# Patient Record
Sex: Female | Born: 1960 | Race: White | Hispanic: No | Marital: Single | Smoking: Never smoker
Health system: Southern US, Community
[De-identification: ages and names within clinical notes are randomized; demographics above are authoritative.]

## PROBLEM LIST (undated history)

## (undated) DIAGNOSIS — C801 Malignant (primary) neoplasm, unspecified: Secondary | ICD-10-CM

## (undated) DIAGNOSIS — R519 Headache, unspecified: Secondary | ICD-10-CM

---

## 1982-04-30 HISTORY — PX: DILATION AND CURETTAGE, DIAGNOSTIC / THERAPEUTIC: SUR384

## 2003-11-25 ENCOUNTER — Other Ambulatory Visit: Admission: RE | Admit: 2003-11-25 | Discharge: 2003-11-25 | Payer: Self-pay | Admitting: Gynecology

## 2003-12-29 ENCOUNTER — Encounter: Admission: RE | Admit: 2003-12-29 | Discharge: 2003-12-29 | Payer: Self-pay | Admitting: Gynecology

## 2005-03-20 ENCOUNTER — Encounter: Admission: RE | Admit: 2005-03-20 | Discharge: 2005-03-20 | Payer: Self-pay | Admitting: Gynecology

## 2005-03-29 ENCOUNTER — Other Ambulatory Visit: Admission: RE | Admit: 2005-03-29 | Discharge: 2005-03-29 | Payer: Self-pay | Admitting: Gynecology

## 2005-04-07 ENCOUNTER — Emergency Department: Payer: Self-pay | Admitting: Unknown Physician Specialty

## 2005-04-17 ENCOUNTER — Encounter: Admission: RE | Admit: 2005-04-17 | Discharge: 2005-04-17 | Payer: Self-pay | Admitting: Gynecology

## 2005-10-29 ENCOUNTER — Encounter: Admission: RE | Admit: 2005-10-29 | Discharge: 2005-10-29 | Payer: Self-pay | Admitting: Gynecology

## 2006-03-25 ENCOUNTER — Encounter: Admission: RE | Admit: 2006-03-25 | Discharge: 2006-03-25 | Payer: Self-pay | Admitting: Gynecology

## 2006-04-11 ENCOUNTER — Other Ambulatory Visit: Admission: RE | Admit: 2006-04-11 | Discharge: 2006-04-11 | Payer: Self-pay | Admitting: Gynecology

## 2006-09-18 ENCOUNTER — Encounter: Payer: Self-pay | Admitting: General Practice

## 2006-09-29 ENCOUNTER — Encounter: Payer: Self-pay | Admitting: General Practice

## 2006-12-16 ENCOUNTER — Emergency Department: Payer: Self-pay | Admitting: Emergency Medicine

## 2006-12-18 ENCOUNTER — Ambulatory Visit: Payer: Self-pay | Admitting: General Practice

## 2006-12-18 IMAGING — CR DG LUMBAR SPINE AP/LAT/OBLIQUES W/ FLEX AND EXT
1 series · 5 of 5 positions shown · non-contrast
Comparison: none

REASON FOR EXAM: low back pain
COMMENTS:

PROCEDURE:     DXR - DXR LUMBAR SPINE WITH OBLIQUES  - [DATE] [DATE]
RESULT:     Comparison: No available comparison exam.

[Series 1: view not recorded · 0.17mm/px · 5 of 5 slices shown]
[im 1/5]
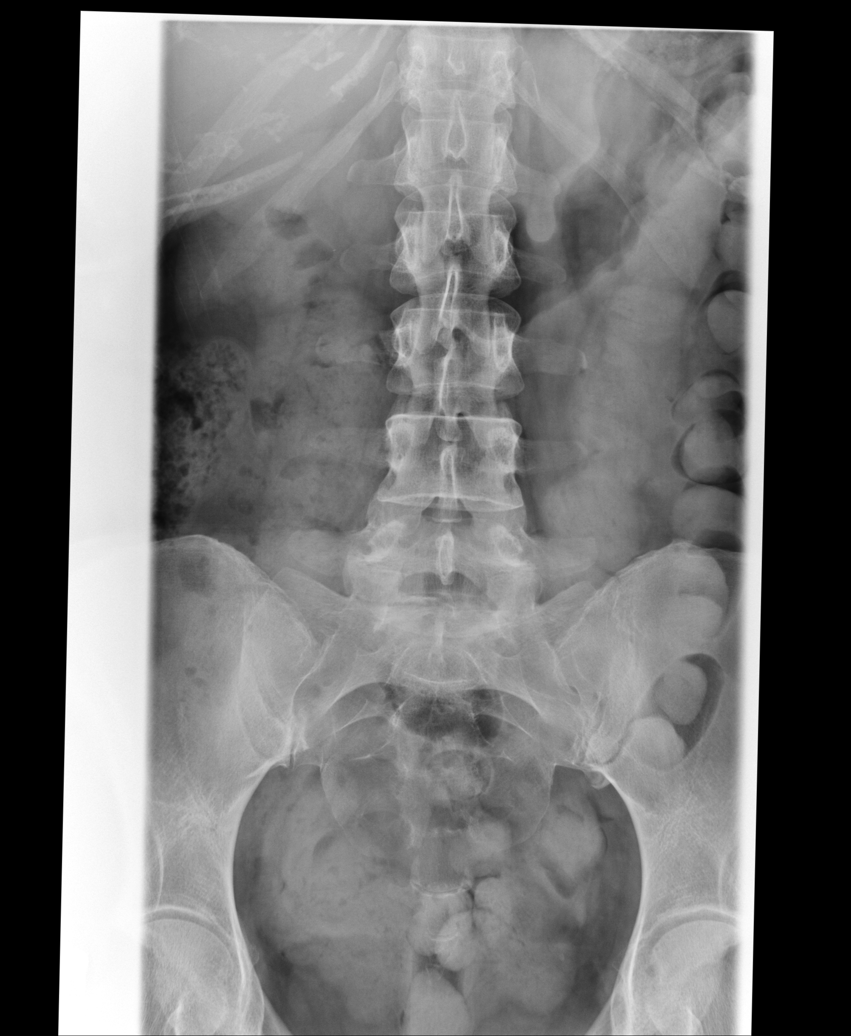
[im 2/5]
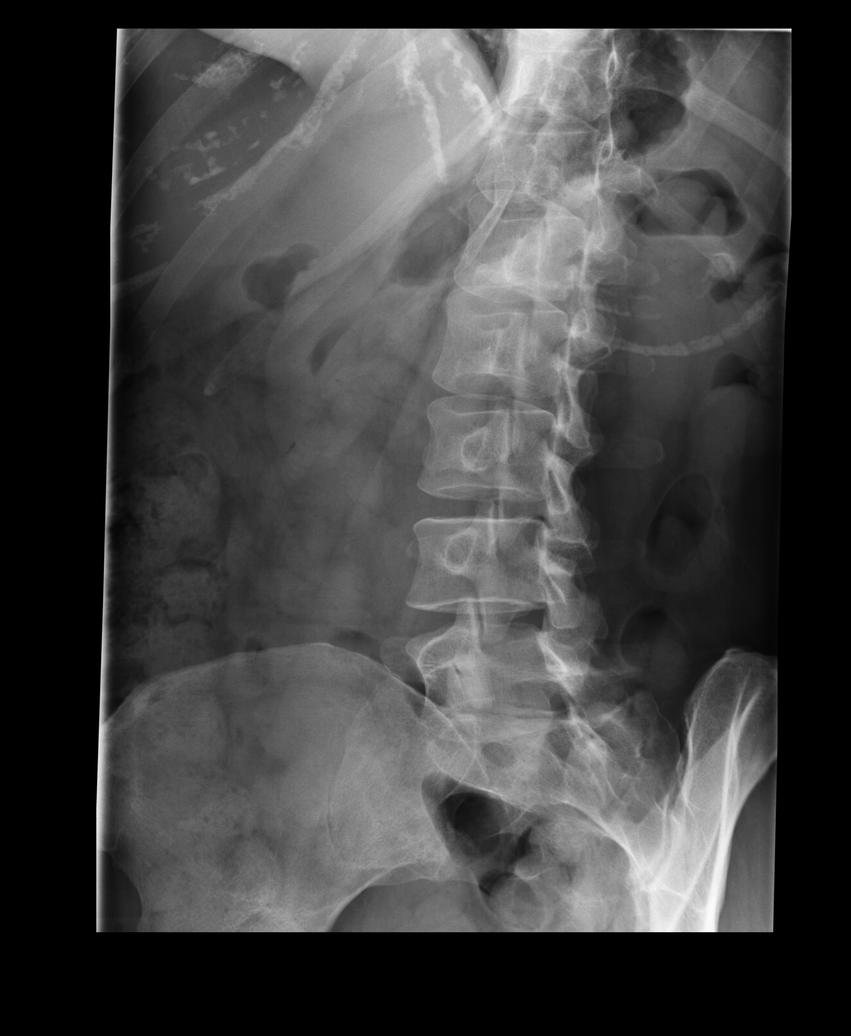
[im 3/5]
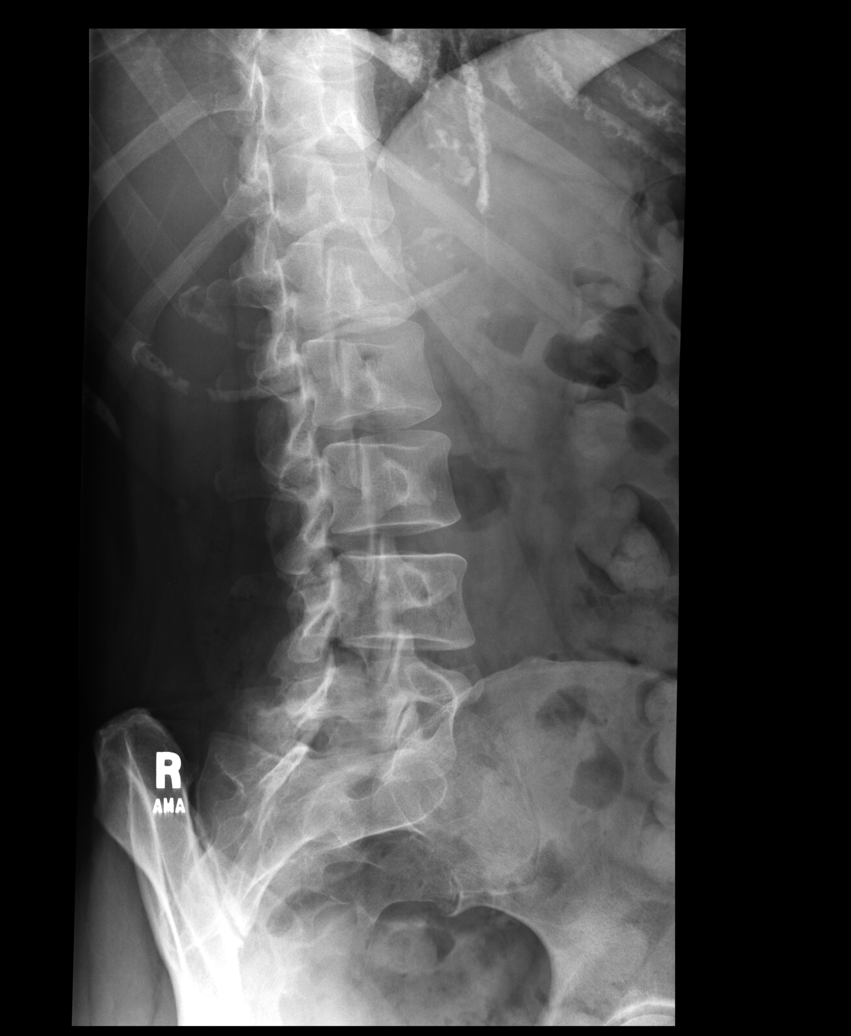
[im 4/5]
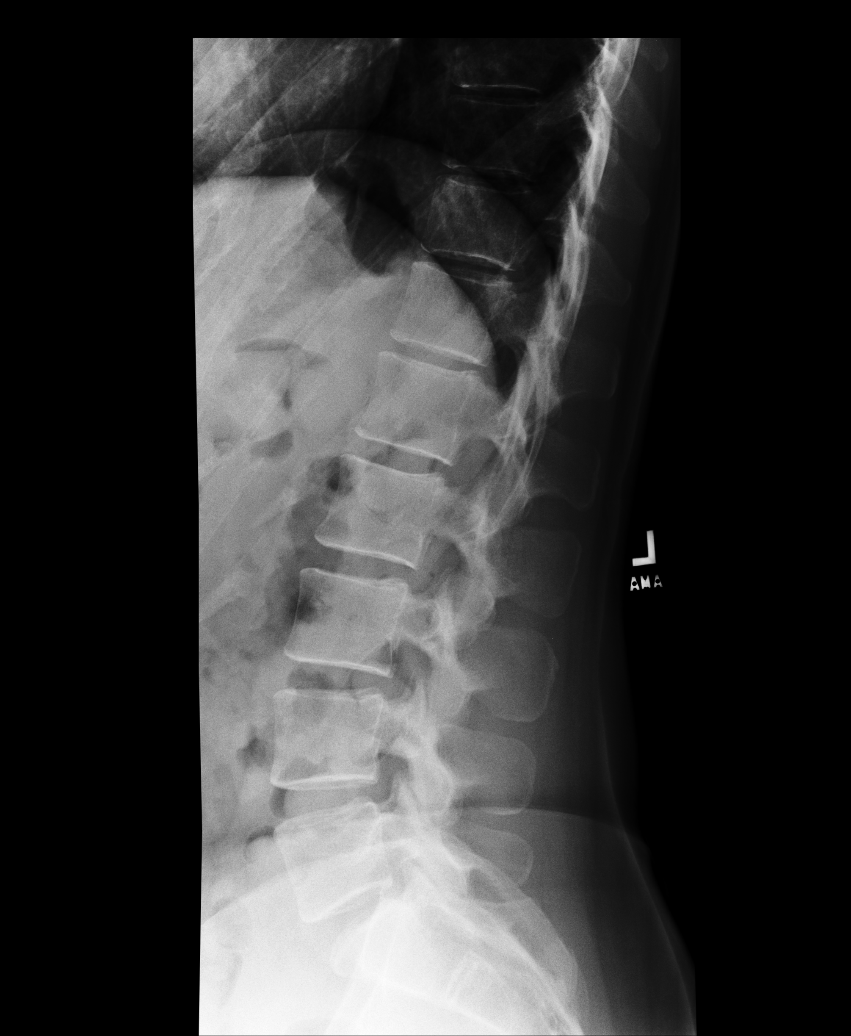
[im 5/5]
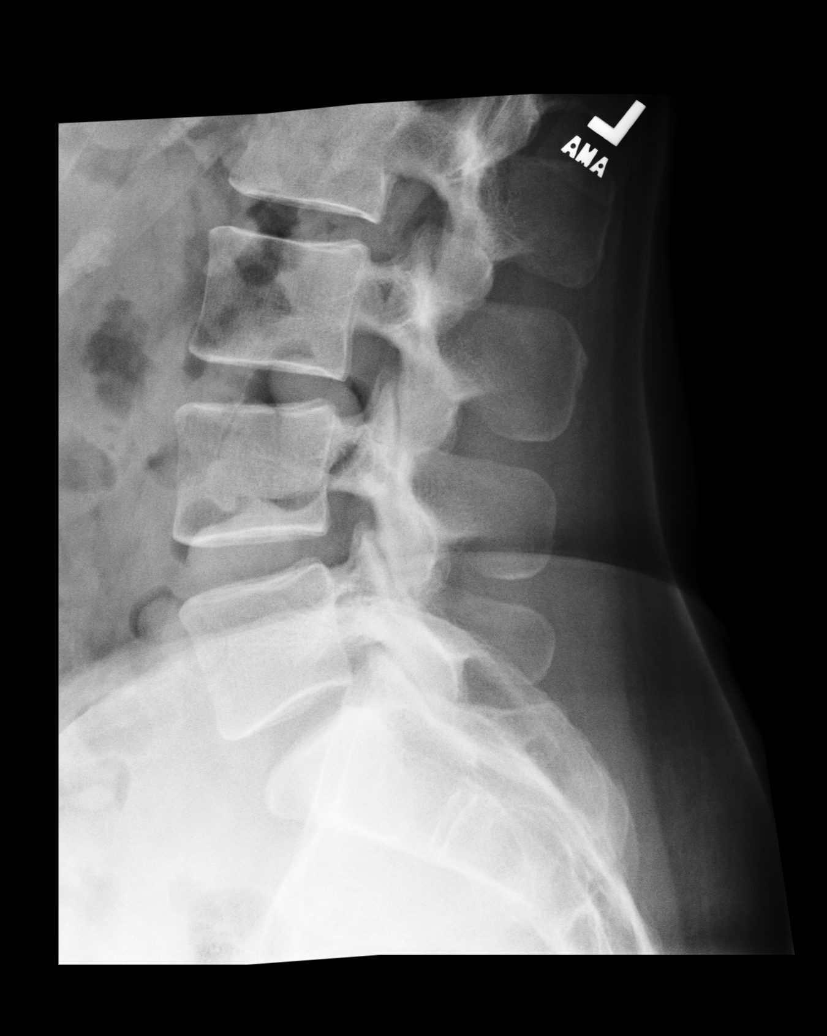

[5 of 5 positions shown; findings below may reference images not displayed]

FINDINGS: Five views of the lumbar spine were obtained.

The lumbar spine is normally alignment. No fracture is noted. The
intervertebral disc spaces are relatively preserved. Moderate to large
amount of stool is noted.
IMPRESSION: 1. Please see above.

## 2007-02-27 ENCOUNTER — Encounter: Payer: Self-pay | Admitting: General Practice

## 2007-03-01 ENCOUNTER — Encounter: Payer: Self-pay | Admitting: General Practice

## 2007-03-31 ENCOUNTER — Encounter: Payer: Self-pay | Admitting: General Practice

## 2007-06-05 ENCOUNTER — Other Ambulatory Visit: Admission: RE | Admit: 2007-06-05 | Discharge: 2007-06-05 | Payer: Self-pay | Admitting: Gynecology

## 2007-06-05 ENCOUNTER — Encounter: Admission: RE | Admit: 2007-06-05 | Discharge: 2007-06-05 | Payer: Self-pay | Admitting: Gynecology

## 2009-07-21 ENCOUNTER — Encounter: Payer: Self-pay | Admitting: General Practice

## 2009-07-29 ENCOUNTER — Encounter: Payer: Self-pay | Admitting: General Practice

## 2009-08-28 ENCOUNTER — Encounter: Payer: Self-pay | Admitting: General Practice

## 2011-11-09 ENCOUNTER — Ambulatory Visit: Payer: Self-pay | Admitting: General Practice

## 2019-04-21 ENCOUNTER — Emergency Department: Payer: 59

## 2019-04-21 ENCOUNTER — Other Ambulatory Visit: Payer: Self-pay

## 2019-04-21 ENCOUNTER — Emergency Department
Admission: EM | Admit: 2019-04-21 | Discharge: 2019-04-21 | Disposition: A | Discharge status: Home / Self Care | Payer: 59 | Attending: Emergency Medicine | Admitting: Emergency Medicine

## 2019-04-21 DIAGNOSIS — R110 Nausea: Secondary | ICD-10-CM | POA: Insufficient documentation

## 2019-04-21 DIAGNOSIS — Z85828 Personal history of other malignant neoplasm of skin: Secondary | ICD-10-CM | POA: Diagnosis not present

## 2019-04-21 DIAGNOSIS — N2889 Other specified disorders of kidney and ureter: Secondary | ICD-10-CM

## 2019-04-21 DIAGNOSIS — K30 Functional dyspepsia: Secondary | ICD-10-CM | POA: Diagnosis not present

## 2019-04-21 DIAGNOSIS — R1012 Left upper quadrant pain: Secondary | ICD-10-CM | POA: Diagnosis present

## 2019-04-21 HISTORY — DX: Malignant (primary) neoplasm, unspecified: C80.1

## 2019-04-21 LAB — COMPREHENSIVE METABOLIC PANEL
ALT: 17 U/L (ref 0–44)
AST: 13 U/L — ABNORMAL LOW (ref 15–41)
Albumin: 4.3 g/dL (ref 3.5–5.0)
Alkaline Phosphatase: 96 U/L (ref 38–126)
Anion gap: 11 (ref 5–15)
BUN: 13 mg/dL (ref 6–20)
CO2: 21 mmol/L — ABNORMAL LOW (ref 22–32)
Calcium: 10.9 mg/dL — ABNORMAL HIGH (ref 8.9–10.3)
Chloride: 106 mmol/L (ref 98–111)
Creatinine, Ser: 0.93 mg/dL (ref 0.44–1.00)
GFR calc Af Amer: >60 mL/min (ref >60)
GFR calc non Af Amer: >60 mL/min (ref >60)
Glucose, Bld: 96 mg/dL (ref 70–99)
Potassium: 4.1 mmol/L (ref 3.5–5.1)
Sodium: 138 mmol/L (ref 135–145)
Total Bilirubin: 0.8 mg/dL (ref 0.3–1.2)
Total Protein: 7.6 g/dL (ref 6.5–8.1)

## 2019-04-21 LAB — URINALYSIS, COMPLETE (UACMP) WITH MICROSCOPIC
Bacteria, UA: NONE SEEN
Bilirubin Urine: NEGATIVE
Glucose, UA: NEGATIVE mg/dL
Hgb urine dipstick: NEGATIVE
Ketones, ur: NEGATIVE mg/dL
Nitrite: NEGATIVE
Protein, ur: NEGATIVE mg/dL
Specific Gravity, Urine: 1.023 (ref 1.005–1.030)
pH: 5 (ref 5.0–8.0)

## 2019-04-21 LAB — CBC
HCT: 46 % (ref 36.0–46.0)
Hemoglobin: 15.4 g/dL — ABNORMAL HIGH (ref 12.0–15.0)
MCH: 29.3 pg (ref 26.0–34.0)
MCHC: 33.5 g/dL (ref 30.0–36.0)
MCV: 87.6 fL (ref 80.0–100.0)
Platelets: 203 10*3/uL (ref 150–400)
RBC: 5.25 MIL/uL — ABNORMAL HIGH (ref 3.87–5.11)
RDW: 12.2 % (ref 11.5–15.5)
WBC: 4.7 10*3/uL (ref 4.0–10.5)
nRBC: 0 % (ref 0.0–0.2)

## 2019-04-21 LAB — LIPASE, BLOOD: Lipase: 23 U/L (ref 11–51)

## 2019-04-21 LAB — C DIFFICILE QUICK SCREEN W PCR REFLEX
C Diff antigen: NEGATIVE
C Diff interpretation: NOT DETECTED
C Diff toxin: NEGATIVE

## 2019-04-21 LAB — POCT PREGNANCY, URINE: Preg Test, Ur: NEGATIVE

## 2019-04-21 MED ORDER — DICYCLOMINE HCL 10 MG/5ML PO SOLN
10.0000 mg | Freq: Once | ORAL | Status: AC
  Administered 2019-04-21: 12:00:00 10 mg via ORAL
  Filled 2019-04-21: qty 5

## 2019-04-21 MED ORDER — IOHEXOL 300 MG/ML  SOLN
100.0000 mL | Freq: Once | INTRAMUSCULAR | Status: AC | PRN
  Administered 2019-04-21: 11:00:00 100 mL via INTRAVENOUS

## 2019-04-21 MED ORDER — IOHEXOL 9 MG/ML PO SOLN
500.0000 mL | ORAL | Status: AC
  Administered 2019-04-21 (×2): 500 mL via ORAL

## 2019-04-21 MED ORDER — SODIUM CHLORIDE 0.9% FLUSH
3.0000 mL | Freq: Once | INTRAVENOUS | Status: AC
  Administered 2019-04-21: 10:00:00 3 mL via INTRAVENOUS

## 2019-04-21 MED ORDER — LIDOCAINE VISCOUS HCL 2 % MT SOLN
15.0000 mL | Freq: Once | OROMUCOSAL | Status: AC
  Administered 2019-04-21: 10:00:00 15 mL via ORAL
  Filled 2019-04-21: qty 15

## 2019-04-21 MED ORDER — DICYCLOMINE HCL 10 MG PO CAPS: 10.0000 mg | ORAL_CAPSULE | Freq: Four times a day (QID) | ORAL | 0 refills | Status: DC

## 2019-04-21 MED ORDER — ALUM & MAG HYDROXIDE-SIMETH 200-200-20 MG/5ML PO SUSP
30.0000 mL | Freq: Once | ORAL | Status: AC
  Administered 2019-04-21: 10:00:00 30 mL via ORAL
  Filled 2019-04-21: qty 30

## 2019-04-21 NOTE — ED Notes (Signed)
Pt presents to the ED for LUQ pain that has been going on for a couple of days. Pt denies NVD and fever. Denies CP and SOB. On assessment, abd is tender to touch in LUQ

## 2019-04-21 NOTE — ED Provider Notes (Signed)
[facility] Emergency Department Provider Note   ____________________________________________   First MD Initiated Contact with Patient 04/21/19 [phone]     (approximate)  I have reviewed the triage vital signs and the nursing notes.   HISTORY  Chief Complaint Abdominal Pain    HPI Tracey Massey is a 58 y.o. female has no significant past medical history except that of distant skin cancer  Patient reports that for about 3 months now she has been experiencing pain that seems to be fairly consistent sometimes worsens located in the left upper quadrant.  Is been associated with change in her stool consistency reportedly feeling like a "fatty" consistency to her stool but no still be formed.  Had a bowel movement this morning.  No black or bloody stool.  Some nausea at times but no vomiting.  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  No chest pain no trouble breathing.  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  No bloody stool.  No vomiting.  Pain currently mild left upper quadrant but will worsen at times sometimes seems to feel like he gets a lot worse if she is had dairy products.  No known exposure to amoebas or obvious infection   Past Medical History:  Diagnosis Date  . Cancer ([place])    skin    There are no problems to display for this patient.   History reviewed. No pertinent surgical history.  Prior to Admission medications   Medication Sig Start Date End Date Taking? Authorizing Provider  dicyclomine (BENTYL) 10 MG capsule Take 1 capsule (10 mg total) by mouth 4 (four) times daily for 7 days. 04/21/19 04/28/19  Delman Kitten, MD     Allergies Patient has no known allergies.  No family history on file.  Social History Social History   Tobacco Use  . Smoking status: Never Smoker  . Smokeless tobacco: Never Used  Substance Use Topics  . Alcohol use: Not Currently  . Drug use: Not Currently    Review of Systems Constitutional: No fever/chills Eyes: No visual changes. ENT: No sore throat. Cardiovascular: Denies chest pain. Respiratory: Denies shortness of breath. Gastrointestinal: See HPI Genitourinary: Negative for dysuria. Musculoskeletal: Negative for back pain. Skin: Negative for rash. Neurological: Negative for weakness.    ____________________________________________   PHYSICAL EXAM:  VITAL SIGNS: ED Triage Vitals  Enc Vitals Group     BP 04/21/19 0859 (!) 152/98     Pulse Rate 04/21/19 0859 92     Resp 04/21/19 0859 18     Temp 04/21/19 0900 98 F (36.7 C)     Temp Source 04/21/19 0859 Oral     SpO2 04/21/19 0859 97 %     Weight 04/21/19 0859 185 lb (83.9 kg)     Height 04/21/19 0859 6\' 1"  (1.854 m)     Head Circumference --      Peak Flow --      Pain Score 04/21/19 0859 5     Pain Loc --      Pain Edu? --      Excl. in [place]? --     Constitutional: Alert and  oriented. Well appearing and in no acute distress. Eyes: Conjunctivae are normal. Head: Atraumatic. Nose: No congestion/rhinnorhea. Mouth/Throat: Mucous membranes are moist. Neck: No stridor.  Cardiovascular: Normal rate, regular rhythm. Grossly normal heart sounds.  Good peripheral circulation. Respiratory: Normal respiratory effort.  No retractions. Lungs CTAB. Gastrointestinal: Soft and nontender except she does report mild to moderate tenderness in the left upper quadrant without peritonitis. No distention. Musculoskeletal: No lower extremity tenderness nor edema. Neurologic:  Normal speech and language. No gross focal neurologic deficits are appreciated.  Skin:  Skin is warm, dry and intact. No rash  noted. Psychiatric: Mood and affect are normal. Speech and behavior are normal.  ____________________________________________   LABS (all labs ordered are listed, but only abnormal results are displayed)  Labs Reviewed  COMPREHENSIVE METABOLIC PANEL - Abnormal; Notable for the following components:      Result Value   CO2 21 (*)    Calcium 10.9 (*)    AST 13 (*)    All other components within normal limits  CBC - Abnormal; Notable for the following components:   RBC 5.25 (*)    Hemoglobin 15.4 (*)    All other components within normal limits  URINALYSIS, COMPLETE (UACMP) WITH MICROSCOPIC - Abnormal; Notable for the following components:   Color, Urine YELLOW (*)    APPearance HAZY (*)    Leukocytes,Ua SMALL (*)    All other components within normal limits  C DIFFICILE QUICK SCREEN W PCR REFLEX  GIARDIA, EIA; OVA/PARASITE  GI PATHOGEN PANEL BY PCR, STOOL  LIPASE, BLOOD  POC URINE PREG, ED  POCT PREGNANCY, URINE   ____________________________________________  EKG  Reviewed entered by me at 9 AM Heart rate 99 QRS 99 QTc 450 Normal sinus rhythm, possible old anterior infarct.  No evidence of acute ischemia ____________________________________________  RADIOLOGY  Discussed risks and benefits of CT scanning including possibility given her symptoms we may not find a cause but given the patient's ongoing symptomatology and likely return to [place] at end of week she would prefer to go with CT imaging to evaluate for gross etiology for pain, and I think this is very reasonable.  This would help [place] exclude some elements of colitis, diverticulitis, mass, or other gross abnormalities that may explain some of her stooling symptom changes as well as left upper quadrant pain.  CT ABDOMEN PELVIS W CONTRAST  Result Date: 04/21/2019 CLINICAL DATA:  Abdominal pain, primarily left-sided EXAM: CT ABDOMEN AND PELVIS WITH CONTRAST TECHNIQUE: Multidetector CT imaging of the abdomen and  pelvis was performed using the standard protocol following bolus administration of intravenous contrast. Oral contrast was also administered. CONTRAST:  133mL OMNIPAQUE IOHEXOL 300 MG/ML  SOLN COMPARISON:  November 09, 2011 FINDINGS: Lower chest: There is atelectatic change in the posterior right base. There is no lung base edema or consolidation. There is pectus excavatum. Hepatobiliary: There is fatty infiltration near the fissure for the ligamentum teres. No focal liver lesions are evident. The gallbladder wall is not appreciably thickened. There is no biliary duct dilatation. Pancreas: No pancreatic mass or inflammatory focus. Spleen: No splenic lesions are evident. Adrenals/Urinary Tract: There is stable left adrenal hypertrophy. Right adrenal appears normal. There is a solid mass arising from the upper pole left kidney measuring 2.3 x 2.2 x 2.2 cm. This mass does not invade the collecting system and is distant from the left renal vein. No other mass evident. There is no hydronephrosis on either side. There is an extrarenal pelvis on the right,  an anatomic variant. There is no evident renal or ureteral calculus on either side. Urinary bladder is midline with wall thickness within normal limits. Stomach/Bowel: There is moderate stool in the colon. There is no appreciable bowel wall or mesenteric thickening. There is no evident bowel obstruction. The terminal ileum appears normal. There is mild lipomatous infiltration of the ileocecal valve. There is no free air or portal venous air. Vascular/Lymphatic: There is no abdominal aortic aneurysm. No vascular lesions are appreciable on this study. Major venous structures appear patent. There is no evident adenopathy in the abdomen or pelvis. Reproductive: Uterus is mildly retroverted. There is no pelvic mass evident. Other: There is no periappendiceal region inflammation. There is no abscess or ascites in the abdomen or pelvis. There is mild fat in the umbilicus.  Musculoskeletal: There are no blastic or lytic bone lesions. There is no intramuscular lesion. IMPRESSION: 1. There is a solid mass arising from the upper pole left kidney somewhat eccentrically measuring 2.3 x 2.2 x 2.2 cm. Renal cell carcinoma must be of concern given this appearance. Further assessment advised. Further evaluation with pre and post contrast MRI or CT should be considered. MRI is preferred in younger patients (due to lack of ionizing radiation) and for evaluating calcified lesion(s). Urologic consultation given this finding also advised. 2. Prominent extrarenal pelvis on the right, an anatomic variant. No hydronephrosis on either side. No renal or ureteral calculus on either side. Urinary bladder wall thickness normal. 3. No bowel wall thickening or bowel obstruction. No abscess evident in the abdomen or pelvis. 4.  Pectus excavatum. Electronically Signed   By: Lowella Grip III M.D.   On: 04/21/2019 11:19     ____________________________________________   PROCEDURES  Procedure(s) performed: None  Procedures  Critical Care performed: No  ____________________________________________   INITIAL IMPRESSION / ASSESSMENT AND PLAN / ED COURSE  Pertinent labs & imaging results that were available during my care of the patient were reviewed by me and considered in my medical decision making (see chart for details).   Differential diagnosis includes but is not limited to, abdominal perforation, aortic dissection, cholecystitis, appendicitis, diverticulitis, colitis, esophagitis/gastritis, kidney stone, pyelonephritis, urinary tract infection, aortic aneurysm. All are considered in decision and treatment plan. Based upon the patient's presentation and risk factors, it seems likely related to some sort of acute intestinal illness or potentially some sort of chronic illness been ongoing for a few months now.  Primarily left upper quadrant pain associated with change in stool  consistency.  We will send stool testing, encourage follow-up with gastroenterology and will screen for acute etiologies with CT scanning.  Outpatient stool studies ordered, patient agreeable to submitting these and follow-up.   Clinical Course as of Apr 20 1333  Tue Apr 21, 2019  1226 Paged urology to discuss CT finding   [MQ]  1310 Urology repaged   [MQ]  1317 History and CT discussed with Dr. Bernardo Heater, Urology.    [MQ]    Clinical Course User Index [MQ] Delman Kitten, MD     ____________________________________________   FINAL CLINICAL IMPRESSION(S) / ED DIAGNOSES  Final diagnoses:  Renal mass  Upset stomach        Note:  This document was prepared using Dragon voice recognition software and may include unintentional dictation errors       Delman Kitten, MD 04/21/19 [phone]

## 2019-04-21 NOTE — ED Notes (Signed)
Pt ambulated to restroom without assistance.

## 2019-04-21 NOTE — ED Triage Notes (Signed)
Pt c/o LUQ pain , new intolerance to dairy products that seem to increase the pain, states she is having mucous like with stools for several months, some nausea with the sx. States this has been going on for several months but in the last several days became unbearable

## 2019-04-25 LAB — GI PATHOGEN PANEL BY PCR, STOOL

## 2019-04-27 LAB — GIARDIA, EIA; OVA/PARASITE: Giardia Ag, Stl: NEGATIVE

## 2019-04-27 LAB — O&P RESULT

## 2019-04-28 ENCOUNTER — Telehealth: Payer: Self-pay | Admitting: Urology

## 2019-04-28 NOTE — Telephone Encounter (Signed)
APP CHANGED LM FOR PT TO CB TO CONFIRM   Tracey Massey

## 2019-04-28 NOTE — Telephone Encounter (Signed)
-----   Message from Abbie Sons, MD sent at 04/28/2019  7:40 AM EST ----- Regarding: FW: Renal mass, needs follow-up This patient needs to be scheduled with Dr. Diamantina Providence or Dr. Su Ley was interested in renal surgery ----- Message ----- From: Delman Kitten, MD Sent: 04/21/2019   1:20 PM EST To: Abbie Sons, MD Subject: Renal mass, needs follow-up                    Needs follow-up. Renal mass

## 2019-05-06 ENCOUNTER — Ambulatory Visit: Payer: Self-pay | Admitting: Urology

## 2019-05-07 ENCOUNTER — Encounter: Payer: Self-pay | Admitting: Urology

## 2019-05-07 ENCOUNTER — Ambulatory Visit (INDEPENDENT_AMBULATORY_CARE_PROVIDER_SITE_OTHER): Payer: 59 | Admitting: Urology

## 2019-05-07 ENCOUNTER — Other Ambulatory Visit: Payer: Self-pay

## 2019-05-07 VITALS — BP 122/83 | HR 80 | Ht 73.0 in | Wt 165.0 lb

## 2019-05-07 DIAGNOSIS — N2889 Other specified disorders of kidney and ureter: Secondary | ICD-10-CM

## 2019-05-07 NOTE — Progress Notes (Addendum)
05/07/19 2:08 PM   Venetia Night L Agnes [birth year] [id number]  CC: Left renal mass  HPI: I saw Ms. Madill in urology clinic today for evaluation of a small left renal mass.  She is a very healthy 59 year old female with no other significant medical problems and no prior abdominal surgeries who was recently seen in the ER on 04/21/2019 with diarrhea and abdominal pain.  A CT scan with contrast was performed and showed a 2.3 cm irregular exophytic left upper pole renal mass concerning for possible RCC.  Enhancement was unable to be evaluated as it was only a contrasted scan.  There is no prior imaging to review.  Labs at her ER visit were benign with normal renal function.  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  She denies any gross hematuria, flank pain, weight loss, or bone pain.  She denies any fevers, chills, night sweats, or constitutional symptoms.  She is a non-smoker and denies any other carcinogenic exposures.   PMH: Past Medical History:  Diagnosis Date  . Cancer [facility])    skin    Surgical History: No prior abdominal surgeries  Allergies: No Known Allergies  Family History: No family history on file.  Social History:  reports that she has never smoked. She has never used smokeless tobacco. She reports previous alcohol use. She reports previous drug use.  ROS: Please see flowsheet from today's date for complete review of systems.  Physical Exam: BP 122/83   Pulse 80   Ht _0  (1.854 m)   Wt 165 lb (74.8 kg)   BMI 21.77 kg/m    Constitutional:  Alert and oriented, No acute distress. Cardiovascular: No clubbing, cyanosis, or edema. Respiratory: Normal respiratory effort, no increased work of breathing. GI: Abdomen is soft, nontender, nondistended, no abdominal masses GU: No CVA tenderness Lymph: No cervical or inguinal lymphadenopathy. Skin: No rashes, bruises or  suspicious lesions. Neurologic: Grossly intact, no focal deficits, moving all 4 extremities. Psychiatric: Normal mood and affect.  Laboratory Data: Reviewed Normal renal function with creatinine 0.93, EGFR greater than 60  Pertinent Imaging: I have personally reviewed the CT dated 12/22, see HPI for details  Assessment & Plan:   In summary the patient is a 59 year old healthy female with a 2.3 cm solid left upper pole renal mass worrisome for possible renal cell carcinoma.  We reviewed her images at length together today.  I recommended further evaluation with an MRI of the abdomen to evaluate for enhancement.  We discussed the role of biopsy and renal masses and consideration of renal biopsy if MRI is equivocal.  We also discussed possible treatment options if this is an enhancing renal mass worrisome for malignancy including ablation, partial nephrectomy, radical nephrectomy.  With her age in good health, tumor location and size, she would be a good candidate for a robotic left partial nephrectomy pending MRI findings.  MRI abdomen for better evaluation of small left renal mass, will call with results  A total of 35 minutes were spent face-to-face with the patient, greater than 50% was spent in patient education, counseling, and coordination of care regarding small renal mass and treatment options.  Addendum: MRI 05/17/2019 shows a 2.2 cm left upper pole solid enhancing renal lesion worrisome for renal cell carcinoma versus oncocytoma.  I discussed these findings with her over the phone this evening.  We discussed treatment options including  active surveillance, robotic partial nephrectomy, or ablation with interventional radiology.  She is working as an Programmer, applications during the Covid pandemic currently and is in [place] at this time.  She reports that she does not have any time off or available for surgery or procedure until December 2021.  We discussed the low, but not zero, risk of  developing metastatic disease on surveillance.  I recommended repeating imaging in 3 to 4 months with CT abdomen pelvis with and without contrast to evaluate for any significant change in the size of the lesion.  She is amenable to this plan.   Virtual visit in 3 months to coordinate CT abdomen pelvis with and without contrast and chest x-ray in [place], will need to obtain report and actual disc so I can review films.   Billey Co, [facility] [address], [place], [place] [address] [phone]

## 2019-05-08 LAB — URINALYSIS, COMPLETE
Bilirubin, UA: NEGATIVE
Glucose, UA: NEGATIVE
Ketones, UA: NEGATIVE
Nitrite, UA: NEGATIVE
Protein,UA: NEGATIVE
RBC, UA: NEGATIVE
Specific Gravity, UA: >1.03 — ABNORMAL HIGH (ref 1.005–1.030)
Urobilinogen, Ur: 0.2 mg/dL (ref 0.2–1.0)
pH, UA: 5.5 (ref 5.0–7.5)

## 2019-05-08 LAB — MICROSCOPIC EXAMINATION
Bacteria, UA: NONE SEEN
RBC, Urine: NONE SEEN /hpf (ref 0–2)

## 2019-05-16 ENCOUNTER — Ambulatory Visit
Admission: RE | Admit: 2019-05-16 | Discharge: 2019-05-16 | Disposition: A | Discharge status: Home / Self Care | Payer: 59 | Source: Ambulatory Visit | Attending: Urology | Admitting: Urology

## 2019-05-16 ENCOUNTER — Other Ambulatory Visit: Payer: Self-pay

## 2019-05-16 DIAGNOSIS — N2889 Other specified disorders of kidney and ureter: Secondary | ICD-10-CM | POA: Diagnosis not present

## 2019-05-16 MED ORDER — GADOBUTROL 1 MMOL/ML IV SOLN
7.5000 mL | Freq: Once | INTRAVENOUS | Status: AC | PRN
  Administered 2019-05-16: 7.5 mL via INTRAVENOUS

## 2019-05-22 ENCOUNTER — Telehealth: Payer: Self-pay | Admitting: Urology

## 2019-05-22 NOTE — Telephone Encounter (Signed)
Pt called and asked if you could give her a call back concerning her MRI results that were given to her yesterday.

## 2019-05-22 NOTE — Telephone Encounter (Signed)
I called pt to make 3 month follow up and she would like to go ahead and schedule surgery.  I told her I would get back with her.  She wants to get on the surgery schedule now.  Let me know what I should do.

## 2019-06-02 ENCOUNTER — Other Ambulatory Visit: Payer: Self-pay | Admitting: Radiology

## 2019-06-08 NOTE — Telephone Encounter (Signed)
LMOM to return call regarding surgery.

## 2019-06-12 ENCOUNTER — Other Ambulatory Visit: Payer: Self-pay | Admitting: Radiology

## 2019-06-12 DIAGNOSIS — N2889 Other specified disorders of kidney and ureter: Secondary | ICD-10-CM

## 2019-06-15 ENCOUNTER — Ambulatory Visit: Payer: 59 | Admitting: Gastroenterology

## 2019-06-19 ENCOUNTER — Other Ambulatory Visit: Payer: 59

## 2019-06-25 ENCOUNTER — Other Ambulatory Visit: Payer: 59

## 2019-07-02 ENCOUNTER — Other Ambulatory Visit: Payer: Self-pay

## 2019-07-02 DIAGNOSIS — N2889 Other specified disorders of kidney and ureter: Secondary | ICD-10-CM

## 2019-07-03 ENCOUNTER — Other Ambulatory Visit (INDEPENDENT_AMBULATORY_CARE_PROVIDER_SITE_OTHER): Payer: 59

## 2019-07-03 ENCOUNTER — Other Ambulatory Visit: Payer: Self-pay

## 2019-07-03 DIAGNOSIS — N2889 Other specified disorders of kidney and ureter: Secondary | ICD-10-CM

## 2019-07-03 LAB — URINALYSIS, COMPLETE
Bilirubin, UA: NEGATIVE
Glucose, UA: NEGATIVE
Ketones, UA: NEGATIVE
Leukocytes,UA: NEGATIVE
Nitrite, UA: NEGATIVE
Protein,UA: NEGATIVE
RBC, UA: NEGATIVE
Specific Gravity, UA: >1.03 — ABNORMAL HIGH (ref 1.005–1.030)
Urobilinogen, Ur: 0.2 mg/dL (ref 0.2–1.0)
pH, UA: 5.5 (ref 5.0–7.5)

## 2019-07-03 LAB — MICROSCOPIC EXAMINATION
Bacteria, UA: NONE SEEN
RBC, Urine: NONE SEEN /hpf (ref 0–2)

## 2019-07-06 LAB — CULTURE, URINE COMPREHENSIVE

## 2019-07-07 ENCOUNTER — Encounter
Admission: RE | Admit: 2019-07-07 | Discharge: 2019-07-07 | Disposition: A | Discharge status: Home / Self Care | Payer: 59 | Source: Ambulatory Visit | Attending: Urology | Admitting: Urology

## 2019-07-07 ENCOUNTER — Other Ambulatory Visit: Payer: Self-pay

## 2019-07-07 DIAGNOSIS — Z01818 Encounter for other preprocedural examination: Secondary | ICD-10-CM | POA: Diagnosis present

## 2019-07-07 HISTORY — DX: Headache, unspecified: R51.9

## 2019-07-07 NOTE — Patient Instructions (Signed)
Your procedure is scheduled on: Monday July 13, 2019 Report to Day Surgery. To find out your arrival time please call [phone] between 1PM - 3PM on Friday July 10, 2019.  Remember: Instructions that are not followed completely may result in serious medical risk,  up to and including death, or upon the discretion of your surgeon and anesthesiologist your  surgery may need to be rescheduled.     _X__ 1. Do not eat food after midnight the night before your procedure.                 No gum chewing or hard candies. You may drink clear liquids up to 2 hours                 before you are scheduled to arrive for your surgery- DO not drink clear                 liquids within 2 hours of the start of your surgery.                 Clear Liquids include:  water, apple juice without pulp, clear Gatorade, G2 or                  Gatorade Zero (avoid Red/Purple/Blue), Black Coffee or Tea (Do not add                 anything to coffee or tea).  __X__2.  On the morning of surgery brush your teeth with toothpaste and water, you                may rinse your mouth with mouthwash if you wish.  Do not swallow any toothpaste of mouthwash.     _X__ 3.  No Alcohol for 24 hours before or after surgery.   _X__ 4.  Do Not Smoke or use e-cigarettes For 24 Hours Prior to Your Surgery.                 Do not use any chewable tobacco products for at least 6 hours prior to                 surgery.  __x__  5.  Notify your doctor if there is any change in your medical condition      (cold, fever, infections).     Do not wear jewelry, make-up, hairpins, clips or nail polish. Do not wear lotions, powders, or perfumes. You may wear deodorant. Do not shave 48 hours prior to surgery. Men may shave face and neck. Do not bring valuables to the hospital.    [facility] is not responsible for any belongings or valuables.  Contacts, dentures or bridgework may not be worn into surgery. Leave  your suitcase in the car. After surgery it may be brought to your room. For patients admitted to the hospital, discharge time is determined by your treatment team.   Patients discharged the day of surgery will not be allowed to drive home.   Make arrangements for someone to be with you for the first 24 hours of your Same Day Discharge.   ____ Take these medicines the morning of surgery with A SIP OF WATER:    1. none   __x__ Use CHG Soap (or wipes) as directed  __x__ Stop Anti-inflammatories such as aspirin, ibuprofen, naproxen, Aleve and or BC powders.   __x__ Stop supplements until after surgery.    __x__ Do not start any herbal  supplements before your surgery.

## 2019-07-09 ENCOUNTER — Other Ambulatory Visit: Payer: Self-pay

## 2019-07-09 ENCOUNTER — Other Ambulatory Visit
Admission: RE | Admit: 2019-07-09 | Discharge: 2019-07-09 | Disposition: A | Discharge status: Home / Self Care | Payer: 59 | Source: Ambulatory Visit | Attending: Urology | Admitting: Urology

## 2019-07-09 DIAGNOSIS — Z20822 Contact with and (suspected) exposure to covid-19: Secondary | ICD-10-CM | POA: Diagnosis not present

## 2019-07-09 DIAGNOSIS — Z01812 Encounter for preprocedural laboratory examination: Secondary | ICD-10-CM | POA: Diagnosis present

## 2019-07-09 LAB — SARS CORONAVIRUS 2 (TAT 6-24 HRS): SARS Coronavirus 2: NEGATIVE

## 2019-07-09 LAB — CBC
HCT: 48.1 % — ABNORMAL HIGH (ref 36.0–46.0)
Hemoglobin: 15.6 g/dL — ABNORMAL HIGH (ref 12.0–15.0)
MCH: 29.3 pg (ref 26.0–34.0)
MCHC: 32.4 g/dL (ref 30.0–36.0)
MCV: 90.4 fL (ref 80.0–100.0)
Platelets: 185 10*3/uL (ref 150–400)
RBC: 5.32 MIL/uL — ABNORMAL HIGH (ref 3.87–5.11)
RDW: 12.5 % (ref 11.5–15.5)
WBC: 4.3 10*3/uL (ref 4.0–10.5)
nRBC: 0 % (ref 0.0–0.2)

## 2019-07-09 LAB — BASIC METABOLIC PANEL
Anion gap: 8 (ref 5–15)
BUN: 15 mg/dL (ref 6–20)
CO2: 26 mmol/L (ref 22–32)
Calcium: 10.4 mg/dL — ABNORMAL HIGH (ref 8.9–10.3)
Chloride: 107 mmol/L (ref 98–111)
Creatinine, Ser: 0.9 mg/dL (ref 0.44–1.00)
GFR calc Af Amer: >60 mL/min (ref >60)
GFR calc non Af Amer: >60 mL/min (ref >60)
Glucose, Bld: 78 mg/dL (ref 70–99)
Potassium: 3.4 mmol/L — ABNORMAL LOW (ref 3.5–5.1)
Sodium: 141 mmol/L (ref 135–145)

## 2019-07-09 LAB — PROTIME-INR
INR: 0.9 (ref 0.8–1.2)
Prothrombin Time: 12.2 seconds (ref 11.4–15.2)

## 2019-07-13 ENCOUNTER — Inpatient Hospital Stay
Admission: RE | Admit: 2019-07-13 | Discharge: 2019-07-15 | DRG: 658 | Disposition: A | Discharge status: Home / Self Care | Payer: 59 | Source: Ambulatory Visit | Attending: Urology | Admitting: Urology

## 2019-07-13 ENCOUNTER — Other Ambulatory Visit: Payer: Self-pay

## 2019-07-13 ENCOUNTER — Inpatient Hospital Stay: Payer: 59 | Admitting: Anesthesiology

## 2019-07-13 ENCOUNTER — Encounter
Admission: RE | Disposition: A | Discharge status: Home / Self Care | Payer: Self-pay | Source: Ambulatory Visit | Attending: Urology

## 2019-07-13 ENCOUNTER — Inpatient Hospital Stay: Payer: 59

## 2019-07-13 ENCOUNTER — Encounter: Payer: Self-pay | Admitting: Urology

## 2019-07-13 DIAGNOSIS — R110 Nausea: Secondary | ICD-10-CM | POA: Diagnosis not present

## 2019-07-13 DIAGNOSIS — D1771 Benign lipomatous neoplasm of kidney: Secondary | ICD-10-CM | POA: Diagnosis present

## 2019-07-13 DIAGNOSIS — Z20822 Contact with and (suspected) exposure to covid-19: Secondary | ICD-10-CM | POA: Diagnosis present

## 2019-07-13 DIAGNOSIS — N2889 Other specified disorders of kidney and ureter: Secondary | ICD-10-CM | POA: Diagnosis present

## 2019-07-13 DIAGNOSIS — Z85828 Personal history of other malignant neoplasm of skin: Secondary | ICD-10-CM | POA: Diagnosis not present

## 2019-07-13 DIAGNOSIS — Z01818 Encounter for other preprocedural examination: Secondary | ICD-10-CM

## 2019-07-13 DIAGNOSIS — D49512 Neoplasm of unspecified behavior of left kidney: Secondary | ICD-10-CM

## 2019-07-13 HISTORY — PX: ROBOTIC ASSITED PARTIAL NEPHRECTOMY: SHX6087

## 2019-07-13 LAB — ABO/RH: ABO/RH(D): A NEG

## 2019-07-13 SURGERY — NEPHRECTOMY, PARTIAL, ROBOT-ASSISTED
Anesthesia: General | Laterality: Left

## 2019-07-13 MED ORDER — LACTATED RINGERS IV SOLN: INTRAVENOUS | Status: DC

## 2019-07-13 MED ORDER — MANNITOL 25 % IV SOLN
INTRAVENOUS | Status: DC | PRN
  Administered 2019-07-13 (×2): 12.5 g via INTRAVENOUS

## 2019-07-13 MED ORDER — FENTANYL CITRATE (PF) 100 MCG/2ML IJ SOLN
INTRAMUSCULAR | Status: AC
  Filled 2019-07-13: qty 2

## 2019-07-13 MED ORDER — CEFAZOLIN SODIUM-DEXTROSE 1-4 GM/50ML-% IV SOLN
1.0000 g | Freq: Three times a day (TID) | INTRAVENOUS | Status: AC
  Administered 2019-07-13: 1 g via INTRAVENOUS
  Filled 2019-07-13: qty 50

## 2019-07-13 MED ORDER — SEVOFLURANE IN SOLN
RESPIRATORY_TRACT | Status: AC
  Filled 2019-07-13: qty 250

## 2019-07-13 MED ORDER — MANNITOL 25 % IV SOLN
INTRAVENOUS | Status: AC
  Filled 2019-07-13: qty 100

## 2019-07-13 MED ORDER — THROMBIN 5000 UNITS EX SOLR
CUTANEOUS | Status: DC | PRN
  Administered 2019-07-13: 5000 [IU] via TOPICAL

## 2019-07-13 MED ORDER — BUPIVACAINE HCL 0.5 % IJ SOLN
INTRAMUSCULAR | Status: DC | PRN
  Administered 2019-07-13: 20 mL

## 2019-07-13 MED ORDER — FENTANYL CITRATE (PF) 100 MCG/2ML IJ SOLN
INTRAMUSCULAR | Status: AC
  Administered 2019-07-13: 25 ug via INTRAVENOUS
  Filled 2019-07-13: qty 2

## 2019-07-13 MED ORDER — ONDANSETRON HCL 4 MG/2ML IJ SOLN
INTRAMUSCULAR | Status: AC
  Filled 2019-07-13: qty 2

## 2019-07-13 MED ORDER — CEFAZOLIN SODIUM-DEXTROSE 2-4 GM/100ML-% IV SOLN
INTRAVENOUS | Status: AC
  Filled 2019-07-13: qty 100

## 2019-07-13 MED ORDER — FENTANYL CITRATE (PF) 100 MCG/2ML IJ SOLN
INTRAMUSCULAR | Status: DC | PRN
  Administered 2019-07-13 (×3): 50 ug via INTRAVENOUS

## 2019-07-13 MED ORDER — DIPHENHYDRAMINE HCL 50 MG/ML IJ SOLN: 12.5000 mg | Freq: Four times a day (QID) | INTRAMUSCULAR | Status: DC | PRN

## 2019-07-13 MED ORDER — SUCCINYLCHOLINE CHLORIDE 200 MG/10ML IV SOSY
PREFILLED_SYRINGE | INTRAVENOUS | Status: AC
  Filled 2019-07-13: qty 10

## 2019-07-13 MED ORDER — BELLADONNA ALKALOIDS-OPIUM 16.2-60 MG RE SUPP
1.0000 | Freq: Four times a day (QID) | RECTAL | Status: DC | PRN
  Filled 2019-07-13: qty 1

## 2019-07-13 MED ORDER — OXYCODONE-ACETAMINOPHEN 5-325 MG PO TABS
ORAL_TABLET | ORAL | Status: AC
  Administered 2019-07-13: 14:00:00 1 via ORAL
  Filled 2019-07-13: qty 1

## 2019-07-13 MED ORDER — POTASSIUM CHLORIDE IN NACL 20-0.9 MEQ/L-% IV SOLN
INTRAVENOUS | Status: DC
  Filled 2019-07-13 (×7): qty 1000

## 2019-07-13 MED ORDER — BUPIVACAINE HCL (PF) 0.5 % IJ SOLN
INTRAMUSCULAR | Status: AC
  Filled 2019-07-13: qty 30

## 2019-07-13 MED ORDER — DOCUSATE SODIUM 100 MG PO CAPS
100.0000 mg | ORAL_CAPSULE | Freq: Two times a day (BID) | ORAL | Status: DC
  Administered 2019-07-13 – 2019-07-15 (×5): 100 mg via ORAL
  Filled 2019-07-13 (×6): qty 1

## 2019-07-13 MED ORDER — FAMOTIDINE 20 MG PO TABS
ORAL_TABLET | ORAL | Status: AC
  Administered 2019-07-13: 07:00:00 20 mg via ORAL
  Filled 2019-07-13: qty 1

## 2019-07-13 MED ORDER — SUCCINYLCHOLINE CHLORIDE 20 MG/ML IJ SOLN
INTRAMUSCULAR | Status: DC | PRN
  Administered 2019-07-13: 100 mg via INTRAVENOUS

## 2019-07-13 MED ORDER — THROMBIN 5000 UNITS EX SOLR
CUTANEOUS | Status: AC
  Filled 2019-07-13: qty 5000

## 2019-07-13 MED ORDER — PROPOFOL 10 MG/ML IV BOLUS
INTRAVENOUS | Status: AC
  Filled 2019-07-13: qty 20

## 2019-07-13 MED ORDER — LIDOCAINE HCL (PF) 2 % IJ SOLN
INTRAMUSCULAR | Status: AC
  Filled 2019-07-13: qty 5

## 2019-07-13 MED ORDER — MIDAZOLAM HCL 2 MG/2ML IJ SOLN
INTRAMUSCULAR | Status: AC
  Filled 2019-07-13: qty 2

## 2019-07-13 MED ORDER — ACETAMINOPHEN 325 MG PO TABS: 650.0000 mg | ORAL_TABLET | ORAL | Status: DC | PRN

## 2019-07-13 MED ORDER — LIDOCAINE HCL (CARDIAC) PF 100 MG/5ML IV SOSY
PREFILLED_SYRINGE | INTRAVENOUS | Status: DC | PRN
  Administered 2019-07-13: 100 mg via INTRAVENOUS

## 2019-07-13 MED ORDER — MIDAZOLAM HCL 2 MG/2ML IJ SOLN
INTRAMUSCULAR | Status: DC | PRN
  Administered 2019-07-13: 2 mg via INTRAVENOUS

## 2019-07-13 MED ORDER — PROPOFOL 10 MG/ML IV BOLUS
INTRAVENOUS | Status: DC | PRN
  Administered 2019-07-13: 150 mg via INTRAVENOUS

## 2019-07-13 MED ORDER — ACETAMINOPHEN 10 MG/ML IV SOLN
INTRAVENOUS | Status: DC | PRN
  Administered 2019-07-13: 1000 mg via INTRAVENOUS

## 2019-07-13 MED ORDER — FAMOTIDINE 20 MG PO TABS: 20.0000 mg | ORAL_TABLET | Freq: Once | ORAL | Status: AC

## 2019-07-13 MED ORDER — ROCURONIUM BROMIDE 100 MG/10ML IV SOLN
INTRAVENOUS | Status: DC | PRN
  Administered 2019-07-13 (×2): 20 mg via INTRAVENOUS
  Administered 2019-07-13: 10 mg via INTRAVENOUS
  Administered 2019-07-13: 40 mg via INTRAVENOUS
  Administered 2019-07-13: 30 mg via INTRAVENOUS

## 2019-07-13 MED ORDER — FUROSEMIDE 10 MG/ML IJ SOLN
INTRAMUSCULAR | Status: AC
  Filled 2019-07-13: qty 4

## 2019-07-13 MED ORDER — MORPHINE SULFATE (PF) 4 MG/ML IV SOLN
2.0000 mg | INTRAVENOUS | Status: DC | PRN
  Administered 2019-07-13: 2 mg via INTRAVENOUS
  Administered 2019-07-15: 4 mg via INTRAVENOUS
  Filled 2019-07-13 (×2): qty 1

## 2019-07-13 MED ORDER — ONDANSETRON HCL 4 MG/2ML IJ SOLN
INTRAMUSCULAR | Status: DC | PRN
  Administered 2019-07-13: 4 mg via INTRAVENOUS

## 2019-07-13 MED ORDER — DIPHENHYDRAMINE HCL 12.5 MG/5ML PO ELIX
12.5000 mg | ORAL_SOLUTION | Freq: Four times a day (QID) | ORAL | Status: DC | PRN
  Filled 2019-07-13: qty 5

## 2019-07-13 MED ORDER — OXYBUTYNIN CHLORIDE 5 MG PO TABS
5.0000 mg | ORAL_TABLET | Freq: Three times a day (TID) | ORAL | Status: DC | PRN
  Administered 2019-07-13: 22:00:00 5 mg via ORAL
  Filled 2019-07-13 (×3): qty 1

## 2019-07-13 MED ORDER — CHLORHEXIDINE GLUCONATE CLOTH 2 % EX PADS: 6.0000 | MEDICATED_PAD | Freq: Every day | CUTANEOUS | Status: DC

## 2019-07-13 MED ORDER — ONDANSETRON HCL 4 MG/2ML IJ SOLN
4.0000 mg | Freq: Once | INTRAMUSCULAR | Status: AC | PRN
  Administered 2019-07-13: 4 mg via INTRAVENOUS

## 2019-07-13 MED ORDER — CEFAZOLIN SODIUM-DEXTROSE 1-4 GM/50ML-% IV SOLN
INTRAVENOUS | Status: AC
  Administered 2019-07-13: 1 g via INTRAVENOUS
  Filled 2019-07-13: qty 50

## 2019-07-13 MED ORDER — FENTANYL CITRATE (PF) 100 MCG/2ML IJ SOLN
25.0000 ug | INTRAMUSCULAR | Status: DC | PRN
  Administered 2019-07-13 (×3): 25 ug via INTRAVENOUS

## 2019-07-13 MED ORDER — SUGAMMADEX SODIUM 200 MG/2ML IV SOLN
INTRAVENOUS | Status: DC | PRN
  Administered 2019-07-13: 200 mg via INTRAVENOUS

## 2019-07-13 MED ORDER — FUROSEMIDE 10 MG/ML IJ SOLN
INTRAMUSCULAR | Status: DC | PRN
  Administered 2019-07-13: 10 mg via INTRAMUSCULAR

## 2019-07-13 MED ORDER — ROCURONIUM BROMIDE 10 MG/ML (PF) SYRINGE
PREFILLED_SYRINGE | INTRAVENOUS | Status: AC
  Filled 2019-07-13: qty 10

## 2019-07-13 MED ORDER — DEXAMETHASONE SODIUM PHOSPHATE 10 MG/ML IJ SOLN
INTRAMUSCULAR | Status: DC | PRN
  Administered 2019-07-13: 10 mg via INTRAVENOUS

## 2019-07-13 MED ORDER — DEXAMETHASONE SODIUM PHOSPHATE 10 MG/ML IJ SOLN
INTRAMUSCULAR | Status: AC
  Filled 2019-07-13: qty 1

## 2019-07-13 MED ORDER — ONDANSETRON HCL 4 MG/2ML IJ SOLN
4.0000 mg | INTRAMUSCULAR | Status: DC | PRN
  Administered 2019-07-13: 4 mg via INTRAVENOUS
  Filled 2019-07-13: qty 2

## 2019-07-13 MED ORDER — "VISTASEAL 4 ML SINGLE DOSE KIT "
PACK | CUTANEOUS | Status: DC | PRN
  Administered 2019-07-13: 4 mL via TOPICAL

## 2019-07-13 MED ORDER — CEFAZOLIN SODIUM-DEXTROSE 1-4 GM/50ML-% IV SOLN
INTRAVENOUS | Status: AC
  Filled 2019-07-13: qty 50

## 2019-07-13 MED ORDER — OXYCODONE-ACETAMINOPHEN 5-325 MG PO TABS
1.0000 | ORAL_TABLET | ORAL | Status: DC | PRN
  Administered 2019-07-13 – 2019-07-14 (×4): 1 via ORAL
  Administered 2019-07-14: 17:00:00 2 via ORAL
  Administered 2019-07-14 (×2): 1 via ORAL
  Administered 2019-07-15 (×3): 2 via ORAL
  Filled 2019-07-13 (×2): qty 2
  Filled 2019-07-13 (×5): qty 1
  Filled 2019-07-13 (×2): qty 2
  Filled 2019-07-13: qty 1

## 2019-07-13 MED ORDER — PROMETHAZINE HCL 25 MG/ML IJ SOLN
12.5000 mg | Freq: Four times a day (QID) | INTRAMUSCULAR | Status: DC | PRN
  Administered 2019-07-13: 21:00:00 12.5 mg via INTRAVENOUS
  Administered 2019-07-15: 6.25 mg via INTRAVENOUS
  Filled 2019-07-13 (×2): qty 1

## 2019-07-13 MED ORDER — CEFAZOLIN SODIUM-DEXTROSE 2-4 GM/100ML-% IV SOLN
2.0000 g | INTRAVENOUS | Status: AC
  Administered 2019-07-13: 2 g via INTRAVENOUS

## 2019-07-13 MED ORDER — OXYBUTYNIN CHLORIDE 5 MG PO TABS
ORAL_TABLET | ORAL | Status: AC
  Administered 2019-07-13: 5 mg via ORAL
  Filled 2019-07-13: qty 1

## 2019-07-13 MED ORDER — ACETAMINOPHEN 10 MG/ML IV SOLN
INTRAVENOUS | Status: AC
  Filled 2019-07-13: qty 100

## 2019-07-13 SURGICAL SUPPLY — 91 items
ANCHOR TIS RET SYS 235ML (MISCELLANEOUS) IMPLANT
APPLICATOR SURGIFLO ENDO (HEMOSTASIS) ×3 IMPLANT
APPLICATOR VISTASEAL 35 (MISCELLANEOUS) ×3 IMPLANT
BNDG COHESIVE 4X5 TAN STRL (GAUZE/BANDAGES/DRESSINGS) IMPLANT
BULB RESERV EVAC DRAIN JP 100C (MISCELLANEOUS) ×3 IMPLANT
CANISTER SUCT 1200ML W/VALVE (MISCELLANEOUS) ×3 IMPLANT
CHLORAPREP W/TINT 26 (MISCELLANEOUS) ×3 IMPLANT
CLIP SUT LAPRA TY ABSORB (SUTURE) ×6 IMPLANT
CLIP VESOLOCK LG 6/CT PURPLE (CLIP) ×12 IMPLANT
CORD BIP STRL DISP 12FT (MISCELLANEOUS) ×3 IMPLANT
COVER TIP SHEARS 8 DVNC (MISCELLANEOUS) ×1 IMPLANT
COVER TIP SHEARS 8MM DA VINCI (MISCELLANEOUS) ×3
COVER WAND RF STERILE (DRAPES) ×3 IMPLANT
DEFOGGER SCOPE WARMER CLEARIFY (MISCELLANEOUS) ×3 IMPLANT
DERMABOND ADVANCED (GAUZE/BANDAGES/DRESSINGS) ×2
DERMABOND ADVANCED .7 DNX12 (GAUZE/BANDAGES/DRESSINGS) ×1 IMPLANT
DRAIN CHANNEL JP 15F RND 16 (MISCELLANEOUS) ×3 IMPLANT
DRAIN CHANNEL JP 19F (MISCELLANEOUS) IMPLANT
DRAPE 3/4 80X56 (DRAPES) ×3 IMPLANT
DRAPE ARM DVNC X/XI (DISPOSABLE) ×4 IMPLANT
DRAPE COLUMN DVNC XI (DISPOSABLE) ×1 IMPLANT
DRAPE DA VINCI XI ARM (DISPOSABLE) ×12
DRAPE DA VINCI XI COLUMN (DISPOSABLE) ×3
DRAPE SURG 17X11 SM STRL (DRAPES) ×12 IMPLANT
ELECT REM PT RETURN 9FT ADLT (ELECTROSURGICAL) ×3
ELECTRODE REM PT RTRN 9FT ADLT (ELECTROSURGICAL) ×1 IMPLANT
GLOVE BIO SURGEON STRL SZ 6.5 (GLOVE) ×6 IMPLANT
GLOVE BIO SURGEONS STRL SZ 6.5 (GLOVE) ×3
GLOVE BIOGEL PI IND STRL 6.5 (GLOVE) ×2 IMPLANT
GLOVE BIOGEL PI INDICATOR 6.5 (GLOVE) ×4
GOWN STRL REUS W/ TWL LRG LVL3 (GOWN DISPOSABLE) ×6 IMPLANT
GOWN STRL REUS W/TWL LRG LVL3 (GOWN DISPOSABLE) ×18
GRASPER SUT TROCAR 14GX15 (MISCELLANEOUS) ×3 IMPLANT
HEMOSTAT SURGICEL 2X14 (HEMOSTASIS) ×3 IMPLANT
IRRIGATION STRYKERFLOW (MISCELLANEOUS) ×1 IMPLANT
IRRIGATOR STRYKERFLOW (MISCELLANEOUS) ×3
IV NS 1000ML (IV SOLUTION) ×3
IV NS 1000ML BAXH (IV SOLUTION) ×1 IMPLANT
KIT PINK PAD W/HEAD ARE REST (MISCELLANEOUS) ×3
KIT PINK PAD W/HEAD ARM REST (MISCELLANEOUS) ×1 IMPLANT
KIT TURNOVER KIT A (KITS) ×3 IMPLANT
KITTNER LAPARASCOPIC 5X40 (MISCELLANEOUS) ×3 IMPLANT
LABEL OR SOLS (LABEL) ×3 IMPLANT
LOOP RED MAXI  1X406MM (MISCELLANEOUS) ×2
LOOP VESSEL MAXI  1X406 RED (MISCELLANEOUS) ×1
LOOP VESSEL MAXI 1X406 RED (MISCELLANEOUS) ×1 IMPLANT
NEEDLE HYPO 25X1 1.5 SAFETY (NEEDLE) ×3 IMPLANT
NEEDLE INSUFFLATION 14GA 120MM (NEEDLE) ×3 IMPLANT
NS IRRIG 500ML POUR BTL (IV SOLUTION) ×3 IMPLANT
OBTURATOR OPTICAL STANDARD 8MM (TROCAR) ×3
OBTURATOR OPTICAL STND 8 DVNC (TROCAR) ×1
OBTURATOR OPTICALSTD 8 DVNC (TROCAR) ×1 IMPLANT
PACK LAP CHOLECYSTECTOMY (MISCELLANEOUS) ×3 IMPLANT
PENCIL ELECTRO HAND CTR (MISCELLANEOUS) ×3 IMPLANT
PORT ACCESS TROCAR AIRSEAL 12 (TROCAR) ×1 IMPLANT
PORT ACCESS TROCAR AIRSEAL 5M (TROCAR) ×2
PROBE ULTRASOUND PROART (MISCELLANEOUS) ×3 IMPLANT
SEAL CANN UNIV 5-8 DVNC XI (MISCELLANEOUS) ×4 IMPLANT
SEAL XI 5MM-8MM UNIVERSAL (MISCELLANEOUS) ×12
SET TRI-LUMEN FLTR TB AIRSEAL (TUBING) ×3 IMPLANT
SET TUBE SMOKE EVAC HIGH FLOW (TUBING) ×3 IMPLANT
SLEEVE ENDOPATH XCEL 5M (ENDOMECHANICALS) ×6 IMPLANT
SOLUTION ELECTROLUBE (MISCELLANEOUS) ×3 IMPLANT
SPOGE SURGIFLO 8M (HEMOSTASIS) ×3
SPONGE DRAIN TRACH 4X4 STRL 2S (GAUZE/BANDAGES/DRESSINGS) ×3 IMPLANT
SPONGE LAP 4X18 RFD (DISPOSABLE) ×3 IMPLANT
SPONGE SURGIFLO 8M (HEMOSTASIS) ×1 IMPLANT
SPONGE VERSALON 4X4 4PLY (MISCELLANEOUS) ×3 IMPLANT
STAPLE RELOAD 2.5MM WHITE (STAPLE) IMPLANT
STAPLER SKIN PROX 35W (STAPLE) ×3 IMPLANT
STAPLER VASCULAR ECHELON 35 (CUTTER) IMPLANT
STRAP SAFETY 5IN WIDE (MISCELLANEOUS) ×9 IMPLANT
SUT DVC VLOC 90 3-0 CV23 UNDY (SUTURE) ×6 IMPLANT
SUT ETHILON 3-0 FS-10 30 BLK (SUTURE) ×3
SUT MNCRL AB 4-0 PS2 18 (SUTURE) ×6 IMPLANT
SUT PDS PLUS 0 (SUTURE)
SUT PDS PLUS AB 0 CT-2 (SUTURE) IMPLANT
SUT PROLENE 4 0 RB 1 (SUTURE) ×3
SUT PROLENE 4-0 RB1 .5 CRCL 36 (SUTURE) ×1 IMPLANT
SUT VIC AB 0 CT1 36 (SUTURE) ×6 IMPLANT
SUT VIC AB 2-0 SH 27 (SUTURE) ×15
SUT VIC AB 2-0 SH 27XBRD (SUTURE) ×5 IMPLANT
SUT VICRYL 0 AB UR-6 (SUTURE) ×6 IMPLANT
SUT VICRYL PLUS ABS 0 54 (SUTURE) ×3 IMPLANT
SUTURE EHLN 3-0 FS-10 30 BLK (SUTURE) ×1 IMPLANT
TAPE CLOTH 3X10 WHT NS LF (GAUZE/BANDAGES/DRESSINGS) ×6 IMPLANT
TRAY FOLEY MTR SLVR 16FR STAT (SET/KITS/TRAYS/PACK) ×3 IMPLANT
TROCAR 12M 150ML BLUNT (TROCAR) ×3 IMPLANT
TROCAR ENDOPATH XCEL 12X100 BL (ENDOMECHANICALS) ×3 IMPLANT
TROCAR XCEL 12X100 BLDLESS (ENDOMECHANICALS) ×3 IMPLANT
TROCAR XCEL NON-BLD 5MMX100MML (ENDOMECHANICALS) ×3 IMPLANT

## 2019-07-13 NOTE — Op Note (Signed)
PREOPERATIVE DIAGNOSIS: Left renal mass    POSTOPERATIVE DIAGNOSIS: Left renal mass    OPERATION PERFORMED: 1. Robotic assisted laparoscopic left partial nephrectomy 2. Intraoperative ultrasound with interpretation.  SURGEON: Hollice Espy, MD.   Assistant Surgeon: Nickolas Madrid, MD  FINDINGS: Hilar anatomy: Single hilar renal artery with accessory lower pole renal artery 2 cm solid renal mass  Warm ischemia time: 13 min.  SPECIMENS: 1. Left renal mass  BLOOD LOSS: 50 cc  ANESTHESIA: General.  COMPLICATIONS: None.  DRAINS: 16-French Foley catheter. 19 Round JP  INDICATIONS FOR OPERATION: Left renal mass  DESCRIPTION OF OPERATION: Informed consent was obtained. The patient was marked on the left side and then taken to the operating room, placed supine on the operating table. General anesthesia was provided. SCDs were provided for DVT prophylaxis and IV antibiotics for bacterial prophylaxis. Foley catheter was placed to drain the bladder. OG tube was placed by anesthesia. The patient was then positioned in right lateral decubitus position with the left flank elevated about 70 degrees. All pressure points were carefully padded. The table was flexed slightly. The left arm was placed in a padded support. The patient was secured to the table with soft chest, hip, and lower extremity straps. The patient was prepped and draped in usual sterile fashion. We had a time-out confirming the patient identification, the planned procedure, the surgical site, and all present were in agreement.  A Veress needle was placed a finger breath below the costal margin in the midclavicular line aspiration, irrigation, and saline drop test confirmed good position. Opening pressure was less than 9 mmHg. The abdomen was insufflated to 15 mmHg. Following this, trocar sites were mapped out including four 8 mm robotic arms in a linear fashion in the midclavicular line.A 12 mm midline  trocars and a 5 mm for the assistant, and an 57mm trocar in the left lower quadrant for the 4th arm.  I used a visual obturator to place a 5 mm trocar at one of the robotic port sites, and the peritoneal cavity was surveyed with no abnormalities or injuries. Remaining trocars were placed under laparoscopic visualization. I used a Minette Headland device to place 0 Vicryl sutures at the 12 mm trocar sites for closure at the end of the case. The robot was docked. I placed monopolar shears in the right arm, [place] bipolar in the left, and a double fenestrated grasper in the 4th arm.   The white line of Toldt was incised and the right colon was reflected. The tail of Gerota's fascia was lifted up and the ureter and gonadal vein were identified. The gonadal vein was left medial and the ureter was lifted up. The psoas muscle was identified under the ureter and I followed this plane towards the renal hilum.  A set of lower pole accessory vessels were identified and spared.  The hilar vessels were identified and exposed circumferentially.   At this point, I incised Gerota's fascia and defatted the kidney to expose the mass.  Particularly, mobilized the upper pole cutting large amount of space between the upper pole margin and the spleen.  Once the tumor was exposed, intraoperative ultrasound was performed. The  mass was imaged and measured, and then excision markings were made. 12.5 gm of mannitol and 10 mg IV lasix were given, and then bulldog clamps were placed on the renal artery x 2.  The lower pole artery was also clamped x2.  Vein was NOT clamped. Warm ischemia time was clocked. The renal mass  was sharply excised and immediately placed in a 10 mm endocatch bag. The first layer of the renorrhaphy was closed with running V-Loc suture, anchored at each end with a Lapra-Ty. A sliding clip renorrhaphy was performed to close the outer layer, using interrupted 2-0 vicryl suture, Weck clips, and  Lapra-Ty's. The capsule edges were nicely approximated. The bulldog clamps were removed. Warm ischemia time was 13 minutes. Additional 12.5 gm of mannitol was given. All needles were extracted. The kidney was well perfused and the renorrhaphy was hemostatic. Gerota's was reapproximated using several Weck clips.  Floseal was placed around the hilum. Hemostasis was achieved.  A 19 round JP drain was placed lateral to the kidney. I reduced the pneumoperitoneal pressure to 7 mmHg and confirmed hemostasis throughout the surgical field. Trocars were removed under direct vision. The tumors were extracted and sent for pathology. Margins were grossly clear for both lesions. At the 12 mm trocar sites, the 0 Vicryl sutures that had been placed at the start of the case were tied down securely. The extraction site was closed with an additional figure-of-eight 0 Vicryl suture in addition to the preclosed previously placed suture. All the wounds were copiously irrigated and then infiltrated with local anesthetic. The skin edges were re approximated with 4-0 Monocryl. Dermabond was applied.   All sponge, needle, and instrument counts were reported correct. The patient was awakened from anesthesia and transferred to recovery in stable condition. There were no complications and the patient tolerated the procedure well. Operative events were discussed with the patient's family.

## 2019-07-13 NOTE — Anesthesia Procedure Notes (Signed)
Procedure Name: Intubation Date/Time: 07/13/2019 7:56 AM Performed by: Aline Brochure, CRNA Pre-anesthesia Checklist: Patient identified, Emergency Drugs available, Suction available and Patient being monitored Patient Re-evaluated:Patient Re-evaluated prior to induction Oxygen Delivery Method: Circle system utilized Preoxygenation: Pre-oxygenation with 100% oxygen Induction Type: IV induction Ventilation: Mask ventilation without difficulty Laryngoscope Size: McGraph and 3 Grade View: Grade I Tube type: Oral Tube size: 7.0 mm Number of attempts: 1 Airway Equipment and Method: Stylet and Video-laryngoscopy Placement Confirmation: ETT inserted through vocal cords under direct vision,  positive ETCO2 and breath sounds checked- equal and bilateral Secured at: 23 cm Tube secured with: Tape Dental Injury: Teeth and Oropharynx as per pre-operative assessment  Comments: Oropharynx appeared red and irritated. Epiglottic area appeared thickened. VC visualized and ETT passed smoothly.

## 2019-07-13 NOTE — H&P (Signed)
H&P updated today 07/13/2019 without changes  Risk and benefits of partial nephrectomy were discussed in detail occluding risk of bleeding, urine leak, damage to any structures, loss of renal unit, decline in overall renal function, bleeding, infection, damage surrounding structures amongst others.  All questions answered.  Regular rate and rhythm CTAB  Tracey Massey 07/17/1960 [id number]  CC: Left renal mass  HPI: I saw Tracey Massey in urology clinic today for evaluation of a small left renal mass.  She is a very healthy 59 year old female with no other significant medical problems and no prior abdominal surgeries who was recently seen in the ER on 04/21/2019 with diarrhea and abdominal pain.  A CT scan with contrast was performed and showed a 2.3 cm irregular exophytic left upper pole renal mass concerning for possible RCC.  Enhancement was unable to be evaluated as it was only a contrasted scan.  There is no prior imaging to review.  Labs at her ER visit were benign with normal renal function.  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  She denies any gross hematuria, flank pain, weight loss, or bone pain.  She denies any fevers, chills, night sweats, or constitutional symptoms.  She is a non-smoker and denies any other carcinogenic exposures.   PMH:     Past Medical History:  Diagnosis Date  . Cancer [facility])    skin    Surgical History: No prior abdominal surgeries  Allergies: No Known Allergies  Family History: No family history on file.  Social History:  reports that she has never smoked. She has never used smokeless tobacco. She reports previous alcohol use. She reports previous drug use.  ROS: Please see flowsheet from today's date for complete review of systems.  Physical Exam: BP 122/83   Pulse 80   Ht '6\' 1"'  (1.854 m)   Wt 165 lb (74.8 kg)   BMI 21.77  kg/m    Constitutional:  Alert and oriented, No acute distress. Cardiovascular: No clubbing, cyanosis, or edema. Respiratory: Normal respiratory effort, no increased work of breathing. GI: Abdomen is soft, nontender, nondistended, no abdominal masses GU: No CVA tenderness Lymph: No cervical or inguinal lymphadenopathy. Skin: No rashes, bruises or suspicious lesions. Neurologic: Grossly intact, no focal deficits, moving all 4 extremities. Psychiatric: Normal mood and affect.  Laboratory Data: Reviewed Normal renal function with creatinine 0.93, EGFR greater than 60  Pertinent Imaging: I have personally reviewed the CT dated 12/22, see HPI for details  Assessment & Plan:   In summary the patient is a 59 year old healthy female with a 2.3 cm solid left upper pole renal mass worrisome for possible renal cell carcinoma.  We reviewed her images at length together today.  I recommended further evaluation with an MRI of the abdomen to evaluate for enhancement.  We discussed the role of biopsy and renal masses and consideration of renal biopsy if MRI is equivocal.  We also discussed possible treatment options if this is an enhancing renal mass worrisome for malignancy including ablation, partial nephrectomy, radical nephrectomy.  With her age in good health, tumor location and size, she would be a good candidate for a robotic left partial nephrectomy pending MRI findings.  MRI abdomen for better evaluation of small left renal mass, will call with results  A total of 35 minutes were spent face-to-face with the patient, greater than 50% was spent in patient  education, counseling, and coordination of care regarding small renal mass and treatment options.  Addendum: MRI 05/17/2019 shows a 2.2 cm left upper pole solid enhancing renal lesion worrisome for renal cell carcinoma versus oncocytoma.  I discussed these findings with her over the phone this evening.  We discussed treatment options  including active surveillance, robotic partial nephrectomy, or ablation with interventional radiology.  She is working as an Programmer, applications during the Covid pandemic currently and is in [place] at this time.  She reports that she does not have any time off or available for surgery or procedure until December 2021.  We discussed the low, but not zero, risk of developing metastatic disease on surveillance.  I recommended repeating imaging in 3 to 4 months with CT abdomen pelvis with and without contrast to evaluate for any significant change in the size of the lesion.  She is amenable to this plan.   Virtual visit in 3 months to coordinate CT abdomen pelvis with and without contrast and chest x-ray in [place], will need to obtain report and actual disc so I can review films.    [facility] [address] [place], [place] [address] [phone]

## 2019-07-13 NOTE — Transfer of Care (Signed)
Immediate Anesthesia Transfer of Care Note  Patient: Tracey Massey  Procedure(s) Performed: XI ROBOTIC ASSITED PARTIAL NEPHRECTOMY (Left )  Patient Location: PACU  Anesthesia Type:General  Level of Consciousness: sedated  Airway & Oxygen Therapy: Patient connected to face mask oxygen  Post-op Assessment: Post -op Vital signs reviewed and stable  Post vital signs: Reviewed and stable  Last Vitals:  Vitals Value Taken Time  BP 116/75 07/13/19 1139  Temp 36.1 C 07/13/19 1139  Pulse 81 07/13/19 1151  Resp 18 07/13/19 1151  SpO2 100 % 07/13/19 1151  Vitals shown include unvalidated device data.  Last Pain:  Vitals:   07/13/19 1139  TempSrc:   PainSc: Asleep         Complications: No apparent anesthesia complications

## 2019-07-13 NOTE — Anesthesia Preprocedure Evaluation (Signed)
Anesthesia Evaluation  Patient identified by MRN, date of birth, ID band Patient awake    Reviewed: Allergy & Precautions, NPO status , Patient's Chart, lab work & pertinent test results  Airway Mallampati: II  TM Distance: >3 FB     Dental  (+) Chipped   Pulmonary neg pulmonary ROS,    Pulmonary exam normal        Cardiovascular negative cardio ROS Normal cardiovascular exam     Neuro/Psych  Headaches, negative psych ROS   GI/Hepatic negative GI ROS, Neg liver ROS,   Endo/Other  negative endocrine ROS  Renal/GU   negative genitourinary   Musculoskeletal negative musculoskeletal ROS (+)   Abdominal Normal abdominal exam  (+)   Peds negative pediatric ROS (+)  Hematology negative hematology ROS (+)   Anesthesia Other Findings Past Medical History: No date: Cancer ([place])     Comment:  skin No date: Headache  Reproductive/Obstetrics                             Anesthesia Physical Anesthesia Plan  ASA: II  Anesthesia Plan: General   Post-op Pain Management:    Induction: Intravenous  PONV Risk Score and Plan:   Airway Management Planned: Oral ETT  Additional Equipment:   Intra-op Plan:   Post-operative Plan: Extubation in OR  Informed Consent: I have reviewed the patients History and Physical, chart, labs and discussed the procedure including the risks, benefits and alternatives for the proposed anesthesia with the patient or authorized representative who has indicated his/her understanding and acceptance.     Dental advisory given  Plan Discussed with: CRNA and Surgeon  Anesthesia Plan Comments:         Anesthesia Quick Evaluation

## 2019-07-13 NOTE — Anesthesia Postprocedure Evaluation (Signed)
Anesthesia Post Note  Patient: Tracey Massey  Procedure(s) Performed: XI ROBOTIC ASSITED PARTIAL NEPHRECTOMY (Left )  Patient location during evaluation: PACU Anesthesia Type: General Level of consciousness: awake and alert and oriented Pain management: pain level controlled Vital Signs Assessment: post-procedure vital signs reviewed and stable Respiratory status: spontaneous breathing Cardiovascular status: blood pressure returned to baseline Anesthetic complications: no     Last Vitals:  Vitals:   07/13/19 1238 07/13/19 1243  BP: (!) 147/91   Pulse: 82 83  Resp: 18 16  Temp:    SpO2: 99% 98%    Last Pain:  Vitals:   07/13/19 1243  TempSrc:   PainSc: 4                  Shabazz Mckey

## 2019-07-14 LAB — PREPARE RBC (CROSSMATCH)

## 2019-07-14 LAB — CBC
HCT: 41.7 % (ref 36.0–46.0)
Hemoglobin: 13.3 g/dL (ref 12.0–15.0)
MCH: 28.7 pg (ref 26.0–34.0)
MCHC: 31.9 g/dL (ref 30.0–36.0)
MCV: 90.1 fL (ref 80.0–100.0)
Platelets: 175 10*3/uL (ref 150–400)
RBC: 4.63 MIL/uL (ref 3.87–5.11)
RDW: 12.8 % (ref 11.5–15.5)
WBC: 10.5 10*3/uL (ref 4.0–10.5)
nRBC: 0 % (ref 0.0–0.2)

## 2019-07-14 LAB — CREATININE, FLUID (PLEURAL, PERITONEAL, JP DRAINAGE): Creat, Fluid: 0.9 mg/dL

## 2019-07-14 LAB — TYPE AND SCREEN
ABO/RH(D): A NEG
Antibody Screen: NEGATIVE
Unit division: 0
Unit division: 0

## 2019-07-14 LAB — BPAM RBC
Blood Product Expiration Date: 202104042359
Blood Product Expiration Date: 202104042359
Unit Type and Rh: 600
Unit Type and Rh: 600

## 2019-07-14 LAB — BASIC METABOLIC PANEL
Anion gap: 7 (ref 5–15)
BUN: 14 mg/dL (ref 6–20)
CO2: 28 mmol/L (ref 22–32)
Calcium: 10.5 mg/dL — ABNORMAL HIGH (ref 8.9–10.3)
Chloride: 105 mmol/L (ref 98–111)
Creatinine, Ser: 0.84 mg/dL (ref 0.44–1.00)
GFR calc Af Amer: >60 mL/min (ref >60)
GFR calc non Af Amer: >60 mL/min (ref >60)
Glucose, Bld: 114 mg/dL — ABNORMAL HIGH (ref 70–99)
Potassium: 4.6 mmol/L (ref 3.5–5.1)
Sodium: 140 mmol/L (ref 135–145)

## 2019-07-14 MED ORDER — HEPARIN SODIUM (PORCINE) 5000 UNIT/ML IJ SOLN
5000.0000 [IU] | Freq: Three times a day (TID) | INTRAMUSCULAR | Status: DC
  Administered 2019-07-14 – 2019-07-15 (×4): 5000 [IU] via SUBCUTANEOUS
  Filled 2019-07-14 (×4): qty 1

## 2019-07-14 NOTE — Care Management (Signed)
TOC consult for PCP needs Patient prefers [facility]. Prefers appointment to be within 1 month  Appointment scheduled with Dr. Teena Dunk 08/12/19 at 1:30.  Patient notified. Appointment added to AVS

## 2019-07-14 NOTE — Progress Notes (Signed)
Urology Inpatient Progress Note  Subjective: Tracey Massey is a 59 y.o. female admitted on 07/13/2019 for scheduled robotic assisted laparoscopic left partial nephrectomy with Dr. Erlene Quan for management of a left renal mass.  A 16 French Foley catheter and 19 round JP drain replaced intraoperatively.  Creatinine stable today, 0.84.  Hemoglobin slightly down today, 13.3.  Today, she reports overall feeling well with mild abdominal and flank soreness.  She has passed flatus.  She did have some postoperative nausea last night improved with promethazine.  She has been ambulating.  Foley catheter removed this a.m.  Drain creatinine drawn this AM.  Diet advanced this a.m.  Heparin resumed this a.m.  Anti-infectives: Anti-infectives (From admission, onward)   Start     Dose/Rate Route Frequency Ordered Stop   07/13/19 1508  ceFAZolin (ANCEF) 1-4 GM/50ML-% IVPB  Status:  Discontinued    Note to Pharmacy: Ronnell Freshwater   : cabinet override      07/13/19 1508 07/13/19 1511   07/13/19 1415  ceFAZolin (ANCEF) IVPB 1 g/50 mL premix     1 g 100 mL/hr over 30 Minutes Intravenous Every 8 hours 07/13/19 1411 07/14/19 0006   07/13/19 0647  ceFAZolin (ANCEF) 2-4 GM/100ML-% IVPB    Note to Pharmacy: Trudie Reed   : cabinet override      07/13/19 0647 07/13/19 0824   07/13/19 0612  ceFAZolin (ANCEF) IVPB 2g/100 mL premix     2 g 200 mL/hr over 30 Minutes Intravenous 30 min pre-op 07/13/19 0612 07/13/19 0826      Current Facility-Administered Medications  Medication Dose Route Frequency Provider Last Rate Last Admin  . 0.9 % NaCl with KCl 20 mEq/ L  infusion   Intravenous Continuous Hollice Espy, MD 100 mL/hr at 07/14/19 0119 New Bag at 07/14/19 0119  . acetaminophen (TYLENOL) tablet 650 mg  650 mg Oral Q4H PRN Hollice Espy, MD      . Chlorhexidine Gluconate Cloth 2 % PADS 6 each  6 each Topical Daily Hollice Espy, MD      . diphenhydrAMINE (BENADRYL) injection 12.5 mg  12.5 mg Intravenous  Q6H PRN Hollice Espy, MD       Or  . diphenhydrAMINE (BENADRYL) 12.5 MG/5ML elixir 12.5 mg  12.5 mg Oral Q6H PRN Hollice Espy, MD      . docusate sodium (COLACE) capsule 100 mg  100 mg Oral BID Hollice Espy, MD   100 mg at 07/14/19 0845  . heparin injection 5,000 Units  5,000 Units Subcutaneous Q8H Hollice Espy, MD   5,000 Units at 07/14/19 0846  . morphine 4 MG/ML injection 2-4 mg  2-4 mg Intravenous Q2H PRN Hollice Espy, MD   2 mg at 07/13/19 1946  . ondansetron (ZOFRAN) injection 4 mg  4 mg Intravenous Q4H PRN Hollice Espy, MD   4 mg at 07/13/19 1801  . opium-belladonna (B&O SUPPRETTES) 16.2-60 MG suppository 1 suppository  1 suppository Rectal Q6H PRN Hollice Espy, MD      . oxybutynin (DITROPAN) tablet 5 mg  5 mg Oral Q8H PRN Hollice Espy, MD   5 mg at 07/13/19 2219  . oxyCODONE-acetaminophen (PERCOCET/ROXICET) 5-325 MG per tablet 1-2 tablet  1-2 tablet Oral Q4H PRN Hollice Espy, MD   1 tablet at 07/14/19 0845  . promethazine (PHENERGAN) injection 12.5 mg  12.5 mg Intravenous Q6H PRN Hollice Espy, MD   12.5 mg at 07/13/19 2039   Objective: Vital signs in last 24 hours: Temp:  [97 F (36.1 C)-98.2 F (  36.8 C)] 98.2 F (36.8 C) (03/16 0404) Pulse Rate:  [74-106] 91 (03/16 0404) Resp:  [8-31] 18 (03/16 0404) BP: (114-154)/(75-99) 114/75 (03/16 0404) SpO2:  [96 %-100 %] 96 % (03/16 0404)  Intake/Output from previous day: 03/15 0701 - 03/16 0700 In: 3856.7 [P.O.:1624; I.V.:1842.8; IV Piggyback:389.9] Out: [id number] [Urine:4000; Drains:35; Blood:50] Intake/Output this shift: No intake/output data recorded.  Physical Exam Vitals and nursing note reviewed.  Constitutional:      General: She is not in acute distress.    Appearance: She is not ill-appearing, toxic-appearing or diaphoretic.  HENT:     Head: Normocephalic and atraumatic.  Pulmonary:     Effort: Pulmonary effort is normal. No respiratory distress.  Abdominal:     Comments: Scattered surgical  incisions visualized over the anterior abdomen, all clean, dry, and intact with overlying surgical adhesive.  Normal postoperative soreness without guarding or rebound.  No flank ecchymosis.  Skin:    General: Skin is warm and dry.  Neurological:     Mental Status: She is alert and oriented to person, place, and time.  Psychiatric:        Mood and Affect: Mood normal.        Behavior: Behavior normal.    Lab Results:  Recent Labs    07/14/19 0557  WBC 10.5  HGB 13.3  HCT 41.7  PLT 175   BMET Recent Labs    07/14/19 0557  NA 140  K 4.6  CL 105  CO2 28  GLUCOSE 114*  BUN 14  CREATININE 0.84  CALCIUM 10.5*   Assessment & Plan: 59 year old female POD 1 from robotic assisted left partial nephrectomy for management of a left renal mass.  Patient is recovering well.  No acute concerns.  Recommendations: -Advance diet as tolerated -Continue ambulation -Follow drain creatinine. -Plan for discharge late this evening versus tomorrow pending drain creatinine results.  Debroah Loop, PA-C 07/14/2019

## 2019-07-15 ENCOUNTER — Other Ambulatory Visit: Payer: Self-pay | Admitting: Urology

## 2019-07-15 DIAGNOSIS — L539 Erythematous condition, unspecified: Secondary | ICD-10-CM

## 2019-07-15 LAB — CBC
HCT: 39.4 % (ref 36.0–46.0)
Hemoglobin: 12.6 g/dL (ref 12.0–15.0)
MCH: 29.2 pg (ref 26.0–34.0)
MCHC: 32 g/dL (ref 30.0–36.0)
MCV: 91.2 fL (ref 80.0–100.0)
Platelets: 151 10*3/uL (ref 150–400)
RBC: 4.32 MIL/uL (ref 3.87–5.11)
RDW: 12.6 % (ref 11.5–15.5)
WBC: 8.3 10*3/uL (ref 4.0–10.5)
nRBC: 0 % (ref 0.0–0.2)

## 2019-07-15 LAB — SURGICAL PATHOLOGY

## 2019-07-15 MED ORDER — OXYBUTYNIN CHLORIDE 5 MG PO TABS: 5.0000 mg | ORAL_TABLET | Freq: Three times a day (TID) | ORAL | 0 refills | Status: AC | PRN

## 2019-07-15 MED ORDER — OXYCODONE-ACETAMINOPHEN 5-325 MG PO TABS: 1.0000 | ORAL_TABLET | ORAL | 0 refills | Status: AC | PRN

## 2019-07-15 MED ORDER — DOCUSATE SODIUM 100 MG PO CAPS: 100.0000 mg | ORAL_CAPSULE | Freq: Two times a day (BID) | ORAL | 0 refills | Status: AC

## 2019-07-15 MED ORDER — BISACODYL 10 MG RE SUPP
10.0000 mg | Freq: Every day | RECTAL | Status: DC | PRN
  Administered 2019-07-15: 10 mg via RECTAL
  Filled 2019-07-15: qty 1

## 2019-07-15 NOTE — Discharge Summary (Addendum)
Date of admission: 07/13/2019  Date of discharge: 07/15/2019  Admission diagnosis: Left renal mass  Discharge diagnosis: Left renal mass status post robotic assisted laparoscopic left partial nephrectomy  Secondary diagnoses:  Patient Active Problem List   Diagnosis Date Noted  . Left renal mass 07/13/2019    History and Physical: For full details, please see admission history and physical. Briefly, Tracey Massey is a 59 y.o. year old patient with a left renal mass who underwent a robotic assisted laparoscopic left partial nephrectomy on July 13, 2019.  Hospital Course: Patient tolerated the procedure well.  She was then transferred to the floor after an uneventful PACU stay.  Her hospital course was uncomplicated.  On POD#2 she had met discharge criteria: was eating a regular diet, was up and ambulating independently,  pain was well controlled, was voiding without a catheter, JP drain removed and was ready to for discharge.  Constitutional:  Well nourished. Alert and oriented, No acute distress. HEENT: [place] AT, moist mucus membranes.  Trachea midline, no masses. Cardiovascular: No clubbing, cyanosis, or edema. Respiratory: Normal respiratory effort, no increased work of breathing. GI: Surgical incisions over the anterior abdomen, all clean, dry and intact with overlying surgical adhesive.  Normal postoperative soreness without guarding or rebound.  Passing flatus during exam.   GU: No CVA tenderness.  No bladder fullness or masses.   Neurologic: Grossly intact, no focal deficits, moving all 4 extremities. Psychiatric: Normal mood and affect.  JP drain with serosanguineous fluid less than 5 cc in bulb.  The securing suture is cut and the JP drain is removed with 2 x 2 gauze and paper tape placed over the incision.  Laboratory values:  Recent Labs    07/14/19 0557 07/15/19 0553  WBC 10.5 8.3  HGB 13.3 12.6  HCT 41.7 39.4   Recent Labs    07/14/19 0557  NA 140  K 4.6  CL  105  CO2 28  GLUCOSE 114*  BUN 14  CREATININE 0.84  CALCIUM 10.5*   No results for input(s): LABPT, INR in the last 72 hours. No results for input(s): LABURIN in the last 72 hours. Results for orders placed or performed during the hospital encounter of 07/09/19  SARS CORONAVIRUS 2 (TAT 6-24 HRS) Nasopharyngeal Nasopharyngeal Swab     Status: None   Collection Time: 07/09/19  7:54 AM   Specimen: Nasopharyngeal Swab  Result Value Ref Range Status   SARS Coronavirus 2 NEGATIVE NEGATIVE Final    Comment: (NOTE) SARS-CoV-2 target nucleic acids are NOT DETECTED. The SARS-CoV-2 RNA is generally detectable in upper and lower respiratory specimens during the acute phase of infection. Negative results do not preclude SARS-CoV-2 infection, do not rule out co-infections with other pathogens, and should not be used as the sole basis for treatment or other patient management decisions. Negative results must be combined with clinical observations, patient history, and epidemiological information. The expected result is Negative. Fact Sheet for Patients: SugarRoll.be Fact Sheet for Healthcare Providers: https://www.woods-mathews.com/ This test is not yet approved or cleared by the [place] FDA and  has been authorized for detection and/or diagnosis of SARS-CoV-2 by FDA under an Emergency Use Authorization (EUA). This EUA will remain  in effect (meaning this test can be used) for the duration of the COVID-19 declaration under Section 56 4(b)(1) of the Act, 21 U.S.C. section 360bbb-3(b)(1), unless the authorization is terminated or revoked sooner. Performed at [facility], [place] [address] [place], [place] [address]  Disposition: Home  Discharge instruction: The patient was instructed to be ambulatory but told to refrain from heavy lifting, strenuous activity, or driving.   Discharge medications:  Allergies as of 07/15/2019       Reactions   Lisinopril Cough      Medication List    STOP taking these medications   dicyclomine 10 MG capsule Commonly known as: Bentyl   Excedrin Migraine 250-250-65 MG tablet Generic drug: aspirin-acetaminophen-caffeine     TAKE these medications   docusate sodium 100 MG capsule Commonly known as: COLACE Take 1 capsule (100 mg total) by mouth 2 (two) times daily.   glucosamine-chondroitin 500-400 MG tablet Take 1 tablet by mouth in the morning and at bedtime.   multivitamin with minerals Tabs tablet Take 1 tablet by mouth daily.   oxybutynin 5 MG tablet Commonly known as: DITROPAN Take 1 tablet (5 mg total) by mouth every 8 (eight) hours as needed for bladder spasms.   oxyCODONE-acetaminophen 5-325 MG tablet Commonly known as: PERCOCET/ROXICET Take 1-2 tablets by mouth every 4 (four) hours as needed for moderate pain.       Followup:  Nickolas Madrid, MD on 07/29/2019 for 2 week post op  Referral to ENT for thickening of the oropharynx seen on intubation   Follow-up Information    Gladstone Lighter, MD. Go on 08/12/2019.   Specialty: Internal Medicine Why: Appointment time 1:30 Contact information: [address] [place] [address] [phone]

## 2019-07-16 ENCOUNTER — Telehealth: Payer: Self-pay | Admitting: Urology

## 2019-07-16 NOTE — Telephone Encounter (Signed)
Spoke to patient and she states she is doing pretty good. She also states she has an appointment with ENT next Tuesday.

## 2019-07-16 NOTE — Telephone Encounter (Signed)
Would you call Tracey Massey and see how she is going after her surgery?  And also remind her we are referring her to ENT.

## 2019-07-29 ENCOUNTER — Ambulatory Visit: Payer: 59 | Admitting: Urology

## 2019-08-03 NOTE — Progress Notes (Signed)
08/04/19 2:08 PM   Eben Burow 21-Aug-1960 [id number]  Referring provider: Gladstone Lighter, MD [address] [place],  [place] [address]  Chief Complaint  Patient presents with  . Follow-up    HPI: Tracey Massey is a 59 y.o. who returns today for a 2 week f/u s/p partial nephrectomy.   She underwent XI robotic assisted laparoscopic left partial nephrectomy on 07/13/19. 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   She reports of no issues post-op.   She is curious on whether she can return to work or do heavy lifting.   Denies gross hematuria.   PMH: Past Medical History:  Diagnosis Date  . Cancer ([place])    skin  . Headache     Surgical History: Past Surgical History:  Procedure Laterality Date  . DILATION AND CURETTAGE, DIAGNOSTIC / THERAPEUTIC  1984  . ROBOTIC ASSITED PARTIAL NEPHRECTOMY Left 07/13/2019   Procedure: XI ROBOTIC ASSITED PARTIAL NEPHRECTOMY;  Surgeon: Hollice Espy, MD;  Location: [facility] ORS;  Service: Urology;  Laterality: Left;    Home Medications:  Allergies as of 08/04/2019      Reactions   Lisinopril Cough      Medication List       Accurate as of August 04, 2019  2:08 PM. If you have any questions, ask your nurse or doctor.        docusate sodium 100 MG capsule Commonly known as: COLACE Take 1 capsule (100 mg total) by mouth 2 (two) times daily.   glucosamine-chondroitin 500-400 MG tablet Take 1 tablet by mouth in the morning and at bedtime.   multivitamin with minerals Tabs tablet Take 1 tablet by mouth daily.   oxybutynin 5 MG tablet Commonly known as: DITROPAN Take 1 tablet (5 mg total) by mouth every 8 (eight) hours as needed for bladder spasms.   oxyCODONE-acetaminophen 5-325 MG tablet Commonly known as: PERCOCET/ROXICET Take 1-2 tablets by mouth every 4 (four) hours as needed for moderate pain.       Allergies:  Allergies  Allergen Reactions  . Lisinopril Cough    Family History: No family history on file.  Social History:  reports that she has never smoked. She has never used smokeless tobacco. She reports previous alcohol use. She reports previous drug use.   Physical Exam: BP (!) 141/92   Pulse (!) 109   Ht 6\' 1"  (1.854 m)   Wt 178 lb (80.7 kg)   BMI 23.48 kg/m   Constitutional:  Alert and oriented, No acute distress. HEENT: [place] AT, moist mucus membranes.  Trachea midline, no masses. Cardiovascular: No clubbing, cyanosis, or edema. Respiratory: Normal respiratory effort, no increased work of breathing. Skin: No rashes, bruises or suspicious lesions. Neurologic: Grossly intact, no focal deficits, moving all 4 extremities. Psychiatric: Normal mood and affect.  Laboratory Data:  Lab Results  Component Value Date   CREATININE 0.84 07/14/2019   SURGICAL PATHOLOGY  CASE: [id number]  PATIENT: [facility]  Surgical Pathology Report      Specimen Submitted:  A. Kidney, left deep base  B. Kidney mass, left   Clinical History: Left kidney tumor     DIAGNOSIS:  A. KIDNEY, LEFT, DEEP BASE; BIOPSY:  - BENIGN RENAL TISSUE.  - NEGATIVE FOR MALIGNANCY.   B. KIDNEY MASS, LEFT; PARTIAL RESECTION:  - ANGIOMYOLIPOMA, 2.6 CM.  - SURGICAL MARGINS NEGATIVE FOR TUMOR.   Comment:  Immunohistochemical studies show dim, patchy staining for Melan-A, with  minimal marking for HMB-45. Tumor cells are negative for Pax-8. These  findings support the above diagnosis.   IHC slides were prepared by [facility] [place] and  Pathology, [place], [place] and [place] for [employer], [place], [place]. All controls stained appropriately.   This test was developed and its performance characteristics determined  by [facility]. It has not been cleared or approved by the [place] Food and Drug  Administration. The FDA does not require this  test to go through  premarket FDA review. This test is used for clinical purposes. It should  not be regarded as investigational or for research. This laboratory is  certified under the Clinical Laboratory Improvement Amendments (CLIA) as  qualified to perform high complexity clinical laboratory testing.   GROSS DESCRIPTION:  A. Labeled: Deep base  Received: In formalin  Tissue fragment(s): 3  Size: 0.9 x 0.7 x 0.2 cm  Description: Aggregate of pink-red tissue fragments  Entirely submitted in 1 cassette.   B. Labeled: Left renal mass  Received: In formalin  Tissue fragment(s): 1  Size: 3.8 x 3.3 x 0.5 cm  Description: A piece of pink-red irregular tissue which has bulging of  the surface. A circumscribed hemorrhagic dark red nodule is present,  measuring 2.6 x 2.3 x 0.7 cm. Due to the nature of the probable resected  margin it cannot be grossly assessed if this nodule extends into it. The  margin is inked blue, and sectioned.  Entirely submitted in cassettes 1-5.    Final Diagnosis performed by Allena Napoleon, MD.  Electronically signed  07/15/2019 3:29:51PM  The electronic signature indicates that the named Attending Pathologist  has evaluated the specimen  Technical component performed at [facility], [address] [place],  [place] [address] Lab: [phone] Dir: Rush Farmer, MD, MMM  Professional component performed at [facility], [facility], [place], [place], [place] [address] Lab: [phone]  Dir: Dellia Nims. Reuel Derby, MD   I have reviewed the surgical path report.   Assessment & Plan:    1. Renal mass No issues s/p robotic assisted laparoscopic partial nephrectomy 07/13/19 Reviewed surgical path, no further imaging needed given surgical margins negative and benign nature of tumor Cleared to return to work 6 weeks post-op  BMP and CBC ordered   F/u prn  [place] [address] [place], [place]  [address] [phone]  I, Lucas Mallow, am acting as a scribe for Dr. Hollice Espy,  I have reviewed the above documentation for accuracy and completeness, and I agree with the above.   Hollice Espy, MD

## 2019-08-04 ENCOUNTER — Encounter: Payer: Self-pay | Admitting: Urology

## 2019-08-04 ENCOUNTER — Other Ambulatory Visit: Payer: Self-pay

## 2019-08-04 ENCOUNTER — Ambulatory Visit (INDEPENDENT_AMBULATORY_CARE_PROVIDER_SITE_OTHER): Payer: 59 | Admitting: Urology

## 2019-08-04 VITALS — BP 141/92 | HR 109 | Ht 73.0 in | Wt 178.0 lb

## 2019-08-04 DIAGNOSIS — N2889 Other specified disorders of kidney and ureter: Secondary | ICD-10-CM

## 2019-08-04 DIAGNOSIS — L539 Erythematous condition, unspecified: Secondary | ICD-10-CM

## 2019-08-05 ENCOUNTER — Telehealth: Payer: Self-pay | Admitting: *Deleted

## 2019-08-05 LAB — BASIC METABOLIC PANEL
BUN/Creatinine Ratio: 20 (ref 9–23)
BUN: 18 mg/dL (ref 6–24)
CO2: 22 mmol/L (ref 20–29)
Calcium: 10.8 mg/dL — ABNORMAL HIGH (ref 8.7–10.2)
Chloride: 105 mmol/L (ref 96–106)
Creatinine, Ser: 0.9 mg/dL (ref 0.57–1.00)
GFR calc Af Amer: 82 mL/min/{1.73_m2} (ref >59)
GFR calc non Af Amer: 71 mL/min/{1.73_m2} (ref >59)
Glucose: 85 mg/dL (ref 65–99)
Potassium: 4.5 mmol/L (ref 3.5–5.2)
Sodium: 140 mmol/L (ref 134–144)

## 2019-08-05 LAB — CBC WITH DIFFERENTIAL
Basophils Absolute: 0 10*3/uL (ref 0.0–0.2)
Basos: 1 %
EOS (ABSOLUTE): 0.1 10*3/uL (ref 0.0–0.4)
Eos: 2 %
Hematocrit: 41.7 % (ref 34.0–46.6)
Hemoglobin: 13.9 g/dL (ref 11.1–15.9)
Immature Grans (Abs): 0 10*3/uL (ref 0.0–0.1)
Immature Granulocytes: 0 %
Lymphocytes Absolute: 1.5 10*3/uL (ref 0.7–3.1)
Lymphs: 30 %
MCH: 29.3 pg (ref 26.6–33.0)
MCHC: 33.3 g/dL (ref 31.5–35.7)
MCV: 88 fL (ref 79–97)
Monocytes Absolute: 0.7 10*3/uL (ref 0.1–0.9)
Monocytes: 13 %
Neutrophils Absolute: 2.7 10*3/uL (ref 1.4–7.0)
Neutrophils: 54 %
RBC: 4.75 x10E6/uL (ref 3.77–5.28)
RDW: 12.2 % (ref 11.7–15.4)
WBC: 5 10*3/uL (ref 3.4–10.8)

## 2019-08-05 NOTE — Telephone Encounter (Addendum)
Patient informed, voiced understanding.    ----- Message from Hollice Espy, MD sent at 08/05/2019  8:09 AM EDT ----- Labs are perfect!  Hollice Espy, MD
